# Patient Record
Sex: Male | Born: 1965 | Hispanic: Yes | Marital: Single | Smoking: Never smoker
Health system: Southern US, Community
[De-identification: ages and names within clinical notes are randomized; demographics above are authoritative.]

## PROBLEM LIST (undated history)

## (undated) DIAGNOSIS — F419 Anxiety disorder, unspecified: Secondary | ICD-10-CM

## (undated) HISTORY — PX: HERNIA REPAIR: SHX51

## (undated) HISTORY — PX: OTHER SURGICAL HISTORY: SHX169

## (undated) HISTORY — PX: APPENDECTOMY: SHX54

## (undated) HISTORY — DX: Anxiety disorder, unspecified: F41.9

## (undated) HISTORY — PX: KNEE ARTHROSCOPY W/ MENISCAL REPAIR: SHX1877

## (undated) HISTORY — PX: NERVE SURGERY: SHX1016

---

## 2003-02-15 ENCOUNTER — Ambulatory Visit (HOSPITAL_BASED_OUTPATIENT_CLINIC_OR_DEPARTMENT_OTHER): Admission: RE | Admit: 2003-02-15 | Discharge: 2003-02-15 | Payer: Self-pay | Admitting: Surgery

## 2007-11-11 ENCOUNTER — Ambulatory Visit (HOSPITAL_COMMUNITY): Admission: RE | Admit: 2007-11-11 | Discharge: 2007-11-11 | Payer: Self-pay | Admitting: Family Medicine

## 2007-11-12 ENCOUNTER — Encounter (INDEPENDENT_AMBULATORY_CARE_PROVIDER_SITE_OTHER): Payer: Self-pay | Admitting: General Surgery

## 2007-11-12 ENCOUNTER — Observation Stay (HOSPITAL_COMMUNITY): Admission: EM | Admit: 2007-11-12 | Discharge: 2007-11-12 | Payer: Self-pay | Admitting: Emergency Medicine

## 2009-03-19 ENCOUNTER — Observation Stay (HOSPITAL_COMMUNITY): Admission: EM | Admit: 2009-03-19 | Discharge: 2009-03-20 | Payer: Self-pay | Admitting: Emergency Medicine

## 2009-03-19 ENCOUNTER — Ambulatory Visit: Payer: Self-pay | Admitting: Family Medicine

## 2009-03-21 ENCOUNTER — Encounter (INDEPENDENT_AMBULATORY_CARE_PROVIDER_SITE_OTHER): Payer: Self-pay | Admitting: *Deleted

## 2009-03-30 ENCOUNTER — Ambulatory Visit: Payer: Self-pay | Admitting: Sports Medicine

## 2009-03-30 ENCOUNTER — Ambulatory Visit (HOSPITAL_COMMUNITY): Admission: RE | Admit: 2009-03-30 | Discharge: 2009-03-30 | Payer: Self-pay | Admitting: Sports Medicine

## 2010-11-28 ENCOUNTER — Encounter: Payer: Self-pay | Admitting: Cardiovascular Disease

## 2010-11-28 ENCOUNTER — Encounter (INDEPENDENT_AMBULATORY_CARE_PROVIDER_SITE_OTHER): Payer: Worker's Compensation

## 2010-11-28 DIAGNOSIS — M79609 Pain in unspecified limb: Secondary | ICD-10-CM

## 2010-11-28 DIAGNOSIS — M7989 Other specified soft tissue disorders: Secondary | ICD-10-CM

## 2010-11-28 DIAGNOSIS — R609 Edema, unspecified: Secondary | ICD-10-CM | POA: Insufficient documentation

## 2010-11-29 ENCOUNTER — Encounter: Payer: Self-pay | Admitting: Cardiovascular Disease

## 2010-12-05 NOTE — Miscellaneous (Signed)
Summary: Orders Update  Clinical Lists Changes  Problems: Added new problem of LEG EDEMA, RIGHT (ICD-782.3) Orders: Added new Test order of Venous Duplex Lower Extremity (Venous Duplex Lower) - Signed

## 2011-01-01 LAB — LIPID PANEL
Cholesterol: 159 mg/dL (ref 0–200)
HDL: 37 mg/dL — ABNORMAL LOW (ref >39)
LDL Cholesterol: 107 mg/dL — ABNORMAL HIGH (ref 0–99)
Triglycerides: 73 mg/dL (ref <150)

## 2011-01-01 LAB — POCT CARDIAC MARKERS
Myoglobin, poc: 63.4 ng/mL (ref 12–200)
Myoglobin, poc: 65.8 ng/mL (ref 12–200)
Troponin i, poc: <0.05 ng/mL (ref 0.00–0.09)
Troponin i, poc: <0.05 ng/mL (ref 0.00–0.09)

## 2011-01-01 LAB — DIFFERENTIAL
Eosinophils Absolute: 0.1 10*3/uL (ref 0.0–0.7)
Eosinophils Relative: 3 % (ref 0–5)
Lymphocytes Relative: 34 % (ref 12–46)
Lymphs Abs: 1.9 10*3/uL (ref 0.7–4.0)
Monocytes Relative: 8 % (ref 3–12)

## 2011-01-01 LAB — BASIC METABOLIC PANEL
Chloride: 105 mEq/L (ref 96–112)
Chloride: 108 mEq/L (ref 96–112)
GFR calc Af Amer: >60 mL/min (ref >60)
GFR calc non Af Amer: >60 mL/min (ref >60)
GFR calc non Af Amer: >60 mL/min (ref >60)
Potassium: 4 mEq/L (ref 3.5–5.1)
Potassium: 4.1 mEq/L (ref 3.5–5.1)
Sodium: 137 mEq/L (ref 135–145)
Sodium: 141 mEq/L (ref 135–145)

## 2011-01-01 LAB — CBC
HCT: 44.6 % (ref 39.0–52.0)
HCT: 44.9 % (ref 39.0–52.0)
Hemoglobin: 15.1 g/dL (ref 13.0–17.0)
Hemoglobin: 15.2 g/dL (ref 13.0–17.0)
MCV: 97.1 fL (ref 78.0–100.0)
MCV: 97.1 fL (ref 78.0–100.0)
Platelets: 217 10*3/uL (ref 150–400)
RBC: 4.62 MIL/uL (ref 4.22–5.81)
WBC: 5.6 10*3/uL (ref 4.0–10.5)
WBC: 6.6 10*3/uL (ref 4.0–10.5)

## 2011-01-01 LAB — TROPONIN I: Troponin I: 0.02 ng/mL (ref 0.00–0.06)

## 2011-01-01 LAB — CARDIAC PANEL(CRET KIN+CKTOT+MB+TROPI)
CK, MB: 1.6 ng/mL (ref 0.3–4.0)
Relative Index: 1.1 (ref 0.0–2.5)
Relative Index: 1.2 (ref 0.0–2.5)

## 2011-01-01 LAB — TSH: TSH: 0.966 u[IU]/mL (ref 0.350–4.500)

## 2011-01-01 LAB — CK TOTAL AND CKMB (NOT AT ARMC): Total CK: 190 U/L (ref 7–232)

## 2011-02-06 NOTE — Op Note (Signed)
NAMEMARVELL, [name]             ACCOUNT NO.:  [id number]   MEDICAL RECORD NO.:  [id number]          PATIENT TYPE:  INP   LOCATION:  5128                         FACILITY:  [facility]   PHYSICIAN:  Gabrielle Dare. [name], M.D.DATE OF BIRTH:  1966-01-07   DATE OF PROCEDURE:  11/12/2007  DATE OF DISCHARGE:                               OPERATIVE REPORT   PREOPERATIVE DIAGNOSIS:  Acute appendicitis.   POSTOPERATIVE DIAGNOSIS:  Acute appendicitis.   PROCEDURE:  Laparoscopic appendectomy.   SURGEON:  Gabrielle Dare. [name], M.D.   ANESTHESIA:  General.   HISTORY OF PRESENT ILLNESS:  [name] is a 45 year old Hispanic  male who developed periumbilical pain last night around 6 p.m.  [facility]  [facility] on [address] and sent for CT scan  at [facility].  CT scan of the abdomen and pelvis demonstrated  acute appendicitis.  He was directed to the emergency department.  I  evaluated him there.  History and physical examination are also  consistent with acute appendicitis and he was brought emergently to the  operating room for laparoscopic appendectomy.   PROCEDURE IN DETAIL:  Informed consent was obtained.  The patient was  identified in the preoperative holding area.  He received intravenous  antibiotics.  He was brought through the operating room and general  anesthesia was administered by anesthesia staff.  His abdomen was  prepped and draped in a sterile fashion.  Infraumbilical region was  infiltrated with quarter percent Marcaine with epinephrine.  Infraumbilical incision was made.  Subcutaneous tissues were dissected  down revealing the anterior fascia.  This was divided sharply and the  peritoneal cavity was entered under direct vision without difficulty.  A  zero Vicryl pursestring suture was placed around the fascial opening and  a Hasson trocar was inserted into the abdomen.  The abdomen was  insufflated with carbon dioxide in standard fashion.   Under direct  vision a left lower quadrant 12 mm port and a 5 mm right mid abdomen  port were placed carefully.  Quarter percent Marcaine with epinephrine  was used at both port sites.  Laparoscopic exploration revealed an  inflamed but not perforated appendix.  The appendix was taken down from  lateral peritoneal attachments with the Harmonic scalpel staying right  along the edge of the appendix.  The mesoappendix was then gradually  divided with the Harmonic scalpel achieving excellent hemostasis.  This  was carefully continued down to the base.  The base of the appendix was  intact and viable.  The base of the appendix was then divided with an  endoscopic GIA stapler with vascular load.  Excellent closure was  obtained and the staple line was intact.  The appendix was placed in an  EndoCatch bag and removed from the abdomen via the left lower quadrant  port site.  The abdomen was copiously irrigated.  One portion of the  mesoappendix had a slight ooze.  This was controlled with careful Bovie  cautery.  The abdomen was copiously irrigated with warm saline.  Irrigation fluid returned clear.  Staple line remained  intact with  excellent closure.  Mesoappendix was dry.  The remainder of the  irrigation fluid was evacuated.  The ports were then removed under  direct vision.  The pneumoperitoneum was released.  The infraumbilical  fascia was closed by thing the zero Vicryl pursestring suture with care  not to trap any intra-abdominal contents.  All three wounds were  copiously irrigated.  The skin at the left lower quadrant and  infraumbilical wound  was closed with a running 4-0 Vicryl subcuticular stitch.  All three  wounds were then closed with Dermabond.  Sponge, needle and instrument  counts were all correct.  The patient tolerated the procedure well  without apparent complications and was taken to the recovery room in  stable condition.      Gabrielle Dare [name], M.D.  Electronically  Signed     BET/MEDQ  D:  11/12/2007  T:  11/12/2007  Job:  [id number]   cc:   [facility]

## 2011-02-06 NOTE — Discharge Summary (Signed)
NAMETANVEER, BRAMMER NO.:  [id number]   MEDICAL RECORD NO.:  [id number]          PATIENT TYPE:  OBV   LOCATION:  3714                         FACILITY:  [facility]   PHYSICIAN:  Pearlean Brownie, M.D.DATE OF BIRTH:  1966-08-17   DATE OF ADMISSION:  03/19/2009  DATE OF DISCHARGE:  03/20/2009                               DISCHARGE SUMMARY   CHIEF COMPLAINT:  Chest pain.   CONSULTATIONS:  None.   DISCHARGE MEDICATIONS:  None.   PROCEDURES:  1. A portable chest x-ray which showed no acute finding.  2. An EKG which showed normal sinus rhythm with a rate of 58 beats per      minute and no ST or T changes.   LABORATORY DATA:  Cardiac enzymes are negative x3.  Point-of-care  cardiac enzymes are negative x2.  On day of discharge, the patient's  basic metabolic panel is within normal limits with a creatinine of 0.98.  His CBC is also within normal limits with a white blood count of 6.6,  hemoglobin of 15.2, and a platelet count of 212.  His TSH is 0.966 and a  fasting lipid profile shows a total cholesterol of 159, triglycerides of  73, HDL of 37, LDL 107, VLDL 15, and a total cholesterol to HDL ratio of  4.3.   BRIEF HOSPITAL COURSE:  The patient is a 45 year old male with no  significant past medical history who is admitted with chest pain that he  described as pressure left-sided and without radiation.  The pain had  been occurring off and on for approximately a week.  He presented to his  primary care physician which is [facility], and was transported  to [facility] by EMS.  Negative chest x-ray.  Negative EKG were obtained  as noted in the labs.  Negative cardiac enzymes x3.  The patient was  started on a beta-blocker overnight and was discontinued.  He was also  given aspirin, this was not continued at discharge.  The patient was  discharged to home in stable medical condition and without any further  chest pain.  He is to follow up with his primary  care physician at  [facility] in a week.  He is to follow up with [facility] for an exercise tolerance test.  He will be  called with an appointment for this test.      Romero Belling, MD  Electronically Signed      Pearlean Brownie, M.D.  Electronically Signed    MO/MEDQ  D:  03/20/2009  T:  03/20/2009  Job:  [id number]

## 2011-02-06 NOTE — H&P (Signed)
Anthony, Cook NO.:  [id number]   MEDICAL RECORD NO.:  [id number]          PATIENT TYPE:  OBV   LOCATION:  3714                         FACILITY:  [facility]   PHYSICIAN:  Pearlean Brownie, M.D.DATE OF BIRTH:  01/04/1966   DATE OF ADMISSION:  03/19/2009  DATE OF DISCHARGE:                              HISTORY & PHYSICAL   PRIMARY CARE PHYSICIAN:  [facility].   CHIEF COMPLAINT:  Chest pain.   HISTORY OF PRESENT ILLNESS:  [name] is a pleasant 45 year old  male who presented to the ED by EMS from [facility] with a  complaint of chest pain.  He describes the chest pain as a pressure, it  is left sided without radiation, it has been occurring off and on for 1  week.  On Wednesday and Thursday, he also felt very shaky with it  like a panic attacking.  The pain is not associated with nausea or  shortness of breath.  He states he has been sweaty with the pain, but  states he has been sweaty most of the week from the hot weather.  He had  similar pain back in May.  He saw his primary care physician who told  him it was likely muscular.  He was given muscle relaxers and the pain  went away.  He decided to go back to the doctor today.  They gave him  324 mg of aspirin there and called EMS.  He denies any aggravating or  relieving factors and states when the pain occurs, it only last a few  minutes.   REVIEW OF SYSTEMS:  He denies any fevers, chills, cough, runny nose,  headache, visual changes, abdominal pain, dysuria, nausea, vomiting,  diarrhea, or change in bowel or bladder.   PAST MEDICAL HISTORY:  Significant only for an appendectomy in February  2009.   MEDICATIONS:  None.   ALLERGIES:  None.   FAMILY HISTORY:  He has 1 sister and 1 brother who had some heart  problem in their 92s that sound like a cath, but no history of MI.  His  parents were healthy except for cancer.   SOCIAL HISTORY:  He is divorced.  He has no children.  He  works for [employer].  He lives alone.  He denies any tobacco or drugs.  He does endorse 2-3  beers in the weekend.   PHYSICAL EXAMINATION:  VITAL SIGNS:  His temperature is 98.1, pulse 62,  respirations 16, blood pressure 128/81, and saturating 100% on 2 L.  GENERAL:  He is alert and oriented x3, comfortable in no acute distress  and pleasant.  HEENT:  His extraocular movements intact.  Pupils equal, round, reactive  to light.  Mucous membranes are moist, and his oropharynx is clear.  NECK:  Full range of motion without lymphadenopathy.  CV:  Regular rate and rhythm without murmur, rub, or gallop.  LUNGS:  Clear to auscultation bilaterally with a normal work of  breathing.  ABDOMEN:  Soft, nontender, nondistended with positive bowel sounds.  EXTREMITIES:  No clubbing, cyanosis, or edema.  SKIN:  Without rash  or lesion.   Chest x-ray shows no acute process.  He had a CBC that was completely  normal with a white count of 5.6 and hemoglobin of 15.1.  He had a BMET  that was also completely normal with a BUN of 19 and creatinine of 0.94.  He has had point-of-care enzymes that were negative x2.  His EKG was  sinus with normal intervals with less than 1 mm ST elevation II, V3, and  V4, but it is relatively unchanged from an EKG on September 30, 2007.   ASSESSMENT AND PLAN:  This is a 45 year old male with chest pain.  1. Chest pain.  We will admit him to a tele floor for chest pain rule      out.  We will check cardiac enzymes q.8 h. x3 and repeat an EKG in      the morning.  We will also check a fasting lipid panel and a TSH.      We will give aspirin and start a low-dose beta-blocker.  Does sound      like he has a family history of coronary artery disease.  His chest      pain does sound mostly atypical.  Give him nitro if needed for      further chest pain.  We will also give Protonix in case there is a      GI component.  2. Prophylaxis.  We will give PPI for possible GI component of chest       pain and Lovenox for DVT prophylaxis.  3. Disposition is pending the results of the cardiac workup.      Ardeen Garland, MD  Electronically Signed      Pearlean Brownie, M.D.  Electronically Signed    LM/MEDQ  D:  03/19/2009  T:  03/19/2009  Job:  [id number]

## 2011-02-06 NOTE — H&P (Signed)
[name], [name]             ACCOUNT NO.:  [id number]   MEDICAL RECORD NO.:  [id number]          PATIENT TYPE:  INP   LOCATION:  5128                         FACILITY:  [facility]   PHYSICIAN:  Gabrielle Dare. [name], M.D.DATE OF BIRTH:  [birth year]   DATE OF ADMISSION:  11/12/2007  DATE OF DISCHARGE:                              HISTORY & PHYSICAL   CHIEF COMPLAINT:  Periumbilical and right lower quadrant abdominal pain.   HISTORY OF PRESENT ILLNESS:  The patient is a 45 year old Hispanic male  who developed periumbilical pain around 6:00 p.m. on November 11, 2007.  [facility] on [address] and was sent for an  outpatient CT scan of the abdomen and pelvis at [facility].  CT  demonstrated acute appendicitis.  He was directed to the emergency  department.  He continues to have pain in the periumbilical region and  right lower quadrant.  He denies nausea and vomiting.   PAST MEDICAL HISTORY:  Negative.   SURGICAL HISTORY:  Right inguinal hernia repair and knee surgery.   SOCIAL HISTORY:  Does not smoke.  He works for [employer].  He does not drink  excessively.   ALLERGIES:  NO KNOWN DRUG ALLERGIES.   MEDICATIONS:  None.   REVIEW OF SYSTEMS:  GI: Localized pain as described above with no  significant nausea and vomiting.  PULMONARY:  Negative.  CARDIOVASCULAR:  Negative.  GU: Negative.  MUSCULOSKELETAL:  Negative.  Remainder of  review of systems was unremarkable.   PHYSICAL EXAMINATION:  VITAL SIGNS:  Temperature 97.3, pulse 71,  respirations 20, blood pressure 111/67.  GENERAL:  He is awake and alert.  He is in no distress.  HEENT:  Pupils are equal and reactive.  Sclerae is clear.  Oral mucosa  is moist.  NECK:  Supple with no masses or tenderness.  LUNGS:  Clear to auscultation with good respiratory effort.  No wheezing  is heard.  HEART:  Regular.  No murmurs are heard.  Impulses palpable in the left  chest.  ABDOMEN:  Tender in the right lower quadrant with  no guarding.  No  masses are felt.  He has hypoactive bowel sounds.  No organomegaly is  noted.  SKIN:  Warm and dry.  No rashes are present.  NEUROLOGIC: He is alert and oriented.  Speech is fluent.  No focal  deficits are noted.   LABORATORY STUDIES:  Sodium 135, potassium 4.4, chloride 102, CO2 25,  BUN 17, creatinine 1.1, glucose 107.  White blood cell count 14,  hemoglobin 16.8, platelets 258.  CT scan of the abdomen and pelvis as  described above.   IMPRESSION:  Acute appendicitis.   PLAN:  We will take him to the operating room for laparoscopic  appendectomy.  We will give him IV antibiotics.  Procedure, risks and  benefits were discussed in detail with the patient.  Questions were  answered and he is agreeable.      Gabrielle Dare [name], M.D.  Electronically Signed     BET/MEDQ  D:  11/12/2007  T:  11/12/2007  Job:  [id number]

## 2011-02-09 NOTE — Op Note (Signed)
[name], [name]                         ACCOUNT NO.:  [id number]   MEDICAL RECORD NO.:  [id number]                   PATIENT TYPE:  AMB   LOCATION:  DSC                                  FACILITY:  [facility]   PHYSICIAN:  Currie Paris, M.D.           DATE OF BIRTH:  August 31, 1966   DATE OF PROCEDURE:  02/15/2003  DATE OF DISCHARGE:                                 OPERATIVE REPORT   [facility] [phone].   PREOPERATIVE DIAGNOSIS:  Right inguinal hernia.   POSTOPERATIVE DIAGNOSIS:  Right inguinal hernia, direct.   PROCEDURE:  Repair, direct right inguinal hernia, with mesh.   SURGEON:  Currie Paris, M.D.   ANESTHESIA:  MAC.   CLINICAL HISTORY:  This patient has recently presented with a right inguinal  hernia that he needed to have repaired.   DESCRIPTION OF PROCEDURE:  The patient was seen in the holding area and had  no further questions.  The right side was identified as the operative side  and the right inguinal are marked.  The patient was then taken to the  operating room and given IV sedation.  The groin area was clipped, prepped,  and draped.  Local was a combination of 1% Xylocaine with epinephrine plus  0.5% plain Marcaine mixed equally.  I infiltrated along the skin line and  then subfascially above the anterior superior iliac spine.  Incision was  made and deepened to the external oblique aponeurosis with bleeders either  electrocoagulated or tied with 4-0 Vicryl.  Additional local was infiltrated  as we got to deeper layers.   The external oblique was exposed and then opened in the line of its fibers  into the superficial ring.  It was lifted up off the underlying muscle and  the cord dissected off and surrounded with a Penrose drain.  There was a  direct hernia present stuck to the cord and really intertwined into the  cremaster muscle, so I had to divide a little bit of that.  I opened the  direct sac a little bit to get better reduction of it and to get it  cleaned  off so that it reduced nicely.  There was no indirect component noted.   Once everything was reduced and we had the cord cleaned off nicely and it  appeared intact, I went ahead and put a large mesh plug into the defect and  held it with several sutures of 2-0 Prolene sutured circumferentially.  I  then put the patch overlay and sutured it with a running suture inferiorly  and then starting at the pubic tubercle and running to the deep ring.  It  was then laid well over the internal oblique.  The ilioinguinal nerve was  noted and brought out through the slit in the lateral portion of the mesh.  The mesh was then tacked down laterally and lay nicely to reconstitute  widely the deep ring.  There  was no tension, everything appeared to close  nicely, and it appeared all dry.  The cord had plenty of room to come  through the mesh.   The external oblique was then closed with some 3-0 Vicryl, Scarpa's, with 3-  0 Vicryl, and skin with 4-0 Monocryl subcuticular plus Dermabond.   The patient tolerated the procedure well.  There were no operative  complications, and all counts were correct.                                               Currie Paris, M.D.    CJS/MEDQ  D:  02/15/2003  T:  02/15/2003  Job:  [id number]   cc:   Tracey Harries, M.D.  [address]  [place]  [place] [address]  Fax: [phone]

## 2011-06-15 LAB — CBC
HCT: 48.9
Hemoglobin: 16.8
Platelets: 258
RBC: 5.19
WBC: 14 — ABNORMAL HIGH

## 2011-06-15 LAB — POCT I-STAT CREATININE: Creatinine, Ser: 1.1

## 2011-06-15 LAB — I-STAT 8, (EC8 V) (CONVERTED LAB)
BUN: 17
Chloride: 102
Glucose, Bld: 107 — ABNORMAL HIGH
Hemoglobin: 17.7 — ABNORMAL HIGH
Operator id: 257131
pCO2, Ven: 42.4 — ABNORMAL LOW

## 2011-06-15 LAB — DIFFERENTIAL
Eosinophils Relative: 0
Lymphocytes Relative: 11 — ABNORMAL LOW
Lymphs Abs: 1.5
Monocytes Absolute: 0.6
Monocytes Relative: 4

## 2011-09-02 ENCOUNTER — Ambulatory Visit (INDEPENDENT_AMBULATORY_CARE_PROVIDER_SITE_OTHER): Payer: BC Managed Care – PPO

## 2011-09-02 DIAGNOSIS — H60339 Swimmer's ear, unspecified ear: Secondary | ICD-10-CM

## 2011-09-02 DIAGNOSIS — R609 Edema, unspecified: Secondary | ICD-10-CM

## 2011-09-02 DIAGNOSIS — R21 Rash and other nonspecific skin eruption: Secondary | ICD-10-CM

## 2011-09-02 DIAGNOSIS — M25519 Pain in unspecified shoulder: Secondary | ICD-10-CM

## 2011-10-24 ENCOUNTER — Other Ambulatory Visit: Payer: Self-pay | Admitting: Family Medicine

## 2011-10-26 ENCOUNTER — Ambulatory Visit (INDEPENDENT_AMBULATORY_CARE_PROVIDER_SITE_OTHER): Payer: BC Managed Care – PPO | Admitting: Family Medicine

## 2011-10-26 VITALS — BP 117/76 | HR 55 | Temp 98.5°F | Resp 16 | Ht 69.0 in | Wt 176.4 lb

## 2011-10-26 DIAGNOSIS — M542 Cervicalgia: Secondary | ICD-10-CM

## 2011-10-26 DIAGNOSIS — M79601 Pain in right arm: Secondary | ICD-10-CM

## 2011-10-26 DIAGNOSIS — M79609 Pain in unspecified limb: Secondary | ICD-10-CM

## 2011-10-26 MED ORDER — METHYLPREDNISOLONE 4 MG PO KIT: PACK | ORAL | Status: AC

## 2011-10-26 NOTE — Progress Notes (Signed)
This is a 46 year old man from [place] with a two-month history of right arm pain and paresthesias. He had originally come in and gotten this evaluated in December and was given several days of Medrol which relieved his symptoms somewhat. Since that time he's been going to chiropractor who has been providing electric stimulation and neck adjustments. Over the past several weeks pain has gotten more intense on the right side including the right arm and right leg and the patient was unable to work a couple days this week. He does not have any loss of motor function he has had no fever he has had no trauma to his neck or arm.  Objective: Patient is in no acute distress and shows no bony muscular or skin changes strength is normal reflexes are normal he has decreased range of motion of his neck with inability to look greater than 45 to the right he has no neck tenderness or adenopathy or bony changes. 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 patient call me back in 48 hours to give me a progress report in 48 hours.   Medrol Dosepak with one refill

## 2012-04-19 ENCOUNTER — Ambulatory Visit (INDEPENDENT_AMBULATORY_CARE_PROVIDER_SITE_OTHER): Payer: BC Managed Care – PPO | Admitting: Family Medicine

## 2012-04-19 VITALS — BP 114/70 | HR 61 | Temp 98.6°F | Resp 20 | Ht 69.0 in | Wt 177.0 lb

## 2012-04-19 DIAGNOSIS — H6990 Unspecified Eustachian tube disorder, unspecified ear: Secondary | ICD-10-CM

## 2012-04-19 DIAGNOSIS — R42 Dizziness and giddiness: Secondary | ICD-10-CM

## 2012-04-19 DIAGNOSIS — H9209 Otalgia, unspecified ear: Secondary | ICD-10-CM

## 2012-04-19 DIAGNOSIS — R079 Chest pain, unspecified: Secondary | ICD-10-CM

## 2012-04-19 DIAGNOSIS — H6980 Other specified disorders of Eustachian tube, unspecified ear: Secondary | ICD-10-CM

## 2012-04-19 LAB — POCT CBC
HCT, POC: 52.6 % (ref 43.5–53.7)
Hemoglobin: 16.9 g/dL (ref 14.1–18.1)
Lymph, poc: 2.3 (ref 0.6–3.4)
MCHC: 32.1 g/dL (ref 31.8–35.4)
POC Granulocyte: 5 (ref 2–6.9)
WBC: 7.8 10*3/uL (ref 4.6–10.2)

## 2012-04-19 LAB — COMPREHENSIVE METABOLIC PANEL
Albumin: 4.6 g/dL (ref 3.5–5.2)
CO2: 28 mEq/L (ref 19–32)
Calcium: 9.3 mg/dL (ref 8.4–10.5)
Glucose, Bld: 93 mg/dL (ref 70–99)
Sodium: 137 mEq/L (ref 135–145)
Total Bilirubin: 0.6 mg/dL (ref 0.3–1.2)
Total Protein: 6.7 g/dL (ref 6.0–8.3)

## 2012-04-19 MED ORDER — OXAPROZIN 600 MG PO TABS: ORAL_TABLET | ORAL | Status: AC

## 2012-04-19 MED ORDER — MECLIZINE HCL 25 MG PO TABS: 25.0000 mg | ORAL_TABLET | Freq: Three times a day (TID) | ORAL | Status: AC | PRN

## 2012-04-19 MED ORDER — FLUTICASONE PROPIONATE 50 MCG/ACT NA SUSP: NASAL | Status: DC

## 2012-04-19 NOTE — Patient Instructions (Signed)
Take the medications as noted. Return if problems arise.

## 2012-04-19 NOTE — Progress Notes (Signed)
Subjective: Patient has been having pain in his ears. It has gone down in his chest and his left arm and having dizziness. He has had some similar symptoms in the past, and reviewing his old records does not reveal any major findings. He works at [employer]. Does not smoke. Does not drink much. He does a lot of physical labor though he does not do regular exercise.  Objective: Smartt red-faced white male no acute distress. He is strong Audiological scientist. His TMs are normal. Throat clear. Neck supple without nodes or thyromegaly. Chest clear. Heart regular without murmurs. Abdomen soft without masses tenderness. Chest wall is a little tender on the left.       Results for orders placed in visit on 04/19/12  POCT CBC      Component Value Range   WBC 7.8  4.6 - 10.2 K/uL   Lymph, poc 2.3  0.6 - 3.4   POC LYMPH PERCENT 30.0  10 - 50 %L   MID (cbc) 0.5  0 - 0.9   POC MID % 5.8  0 - 12 %M   POC Granulocyte 5.0  2 - 6.9   Granulocyte percent 64.2  37 - 80 %G   RBC 5.28  4.69 - 6.13 M/uL   Hemoglobin 16.9  14.1 - 18.1 g/dL   HCT, POC 47.8  29.5 - 53.7 %   MCV 99.7 (*) 80 - 97 fL   MCH, POC 32.0 (*) 27 - 31.2 pg   MCHC 32.1  31.8 - 35.4 g/dL   RDW, POC 62.1     Platelet Count, POC 287  142 - 424 K/uL   MPV 8.7  0 - 99.8 fL   Ekg:  Normal  Assessment: Dizziness Otalgia Chest pains, musculoskeletal Eustachian tube dysfunction  Plan: Treated for eustachian tube dysfunction and chest wall pain. Dizziness.

## 2012-04-21 ENCOUNTER — Encounter: Payer: Self-pay | Admitting: Family Medicine

## 2013-06-01 ENCOUNTER — Ambulatory Visit (INDEPENDENT_AMBULATORY_CARE_PROVIDER_SITE_OTHER): Payer: BC Managed Care – PPO | Admitting: Family Medicine

## 2013-06-01 VITALS — BP 122/72 | HR 61 | Temp 98.0°F | Resp 17 | Ht 69.0 in | Wt 182.0 lb

## 2013-06-01 DIAGNOSIS — R51 Headache: Secondary | ICD-10-CM

## 2013-06-01 DIAGNOSIS — H5711 Ocular pain, right eye: Secondary | ICD-10-CM

## 2013-06-01 DIAGNOSIS — H5710 Ocular pain, unspecified eye: Secondary | ICD-10-CM

## 2013-06-01 LAB — POCT SEDIMENTATION RATE: POCT SED RATE: 7 mm/hr (ref 0–22)

## 2013-06-01 NOTE — Progress Notes (Signed)
[facility]:  Office Visit  Chief Complaint:  Chief Complaint  Patient presents with  . Foreign Body in Eye    HPI: [name] is a 47 y.o. male who complains of having something in his eye x 1 week, noticed it after wind may have gotten something in eye. He extracted a piece of clear object at the corner of his eye after the incident, maybe sand he states. 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  He now has right sided temporal HA, he states it is uncomfortable. He denies photophobia, swelling redness or drainage.He has had no prior eye issues with this eye.  He feels like "it"  moves around his eye. He denies burning, numbness, tingling. Denies rash. Deneis itching.   History reviewed. No pertinent past medical history. History reviewed. No pertinent past surgical history. History   Social History  . Marital Status: Single    Spouse Name: N/A    Number of Children: N/A  . Years of Education: N/A   Social History Main Topics  . Smoking status: Never Smoker   . Smokeless tobacco: None  . Alcohol Use: No  . Drug Use: No  . Sexual Activity: No   Other Topics Concern  . None   Social History Narrative  . None   Family History  Problem Relation Age of Onset  . Cancer Mother    No Known Allergies Prior to Admission medications   Not on File     ROS: The patient denies fevers, chills, night sweats, unintentional weight loss, chest pain, palpitations, wheezing, dyspnea on exertion, nausea, vomiting, abdominal pain, dysuria, hematuria, melena, numbness, weakness, or tingling.   All other systems have been reviewed and were otherwise negative with the exception of those mentioned in the HPI and as above.    PHYSICAL EXAM: Filed Vitals:   06/01/13 1129  BP: 122/72  Pulse: 61  Temp: 98 F (36.7 C)  Resp: 17   Filed Vitals:   06/01/13 1129  Height: 5\' 9"  (1.753 m)   Weight: 182 lb (82.555 kg)   Body mass index is 26.86 kg/(m^2).  General: Alert, no acute distress HEENT:  Normocephalic, atraumatic, oropharynx patent. EOMI, PERRLA, fundoscopic exam nl, 2 small pinpoint pimple like papules does not appear to be infected or acute onupper eyelid when flipped over Cardiovascular:  Regular rate and rhythm, no rubs murmurs or gallops.  No Carotid bruits, radial pulse intact. No pedal edema.  Respiratory: Clear to auscultation bilaterally.  No wheezes, rales, or rhonchi.  No cyanosis, no use of accessory musculature GI: No organomegaly, abdomen is soft and non-tender, positive bowel sounds.  No masses. Skin: No rashes. Neurologic: Facial musculature symmetric. Psychiatric: Patient is appropriate throughout our interaction. Lymphatic: No cervical lymphadenopathy Musculoskeletal: Gait intact.   LABS: Results for orders placed in visit on 06/01/13  POCT SEDIMENTATION RATE      Result Value Range   POCT SED RATE 7  0 - 22 mm/hr     EKG/XRAY:   Primary read interpreted by Dr. Conley Rolls at [facility].   ASSESSMENT/PLAN: Encounter Diagnoses  Name Primary?  . Headache(784.0) Yes  . Eye pain, right     Fluoroscein exam was normal, no uptake Did not see anything on eye exam, we were able to flush eye out with saline water and he felt slightly better Vision test is normal Will check ESR for  temporal HA Keep eyes moist with natural tears QID, f/u prn No need for abx since does not appear to be infected Gross sideeffects, risk and benefits, and alternatives of medications d/w patient. Patient is aware that all medications have potential sideeffects and we are unable to predict every sideeffect or drug-drug interaction that may occur.  Maresa Morash PHUONG, DO 06/03/2013 5:00 PM    LM on 06/03/13 @ 5:11 pm to see how he is doing. Lm regarding also normal ESR.

## 2013-06-03 ENCOUNTER — Encounter: Payer: Self-pay | Admitting: *Deleted

## 2013-06-03 ENCOUNTER — Telehealth: Payer: Self-pay | Admitting: *Deleted

## 2013-06-03 NOTE — Telephone Encounter (Signed)
Pt states that his eye is no better and would like to be referred to Dr. Dione Booze.

## 2013-06-04 ENCOUNTER — Other Ambulatory Visit: Payer: Self-pay | Admitting: Family Medicine

## 2013-06-04 DIAGNOSIS — H5789 Other specified disorders of eye and adnexa: Secondary | ICD-10-CM

## 2013-11-01 ENCOUNTER — Ambulatory Visit (INDEPENDENT_AMBULATORY_CARE_PROVIDER_SITE_OTHER): Payer: BC Managed Care – PPO | Admitting: Family Medicine

## 2013-11-01 VITALS — BP 120/81 | HR 72 | Temp 98.6°F | Resp 18 | Wt 185.0 lb

## 2013-11-01 DIAGNOSIS — R21 Rash and other nonspecific skin eruption: Secondary | ICD-10-CM

## 2013-11-01 DIAGNOSIS — K4090 Unilateral inguinal hernia, without obstruction or gangrene, not specified as recurrent: Secondary | ICD-10-CM

## 2013-11-01 MED ORDER — CLOTRIMAZOLE 1 % EX CREA: 1.0000 "application " | TOPICAL_CREAM | Freq: Two times a day (BID) | CUTANEOUS | Status: DC

## 2013-11-01 NOTE — Patient Instructions (Signed)
We will refer you to surgeon for the hernia on the left side. Avoid heavy lifting or straining until that evaluation. Return to the clinic or go to the nearest emergency room if any of your symptoms worsen or new symptoms occur.  Try the cream to the arm rash for next 2 weeks. If other rash returns - return for recheck.   Inguinal Hernia, Adult Muscles help keep everything in the body in its proper place. But if a weak spot in the muscles develops, something can poke through. That is called a hernia. When this happens in the lower part of the belly (abdomen), it is called an inguinal hernia. (It takes its name from a part of the body in this region called the inguinal canal.) A weak spot in the wall of muscles lets some fat or part of the small intestine bulge through. An inguinal hernia can develop at any age. Men get them more often than women. CAUSES  In adults, an inguinal hernia develops over time.  It can be triggered by:  Suddenly straining the muscles of the lower abdomen.  Lifting heavy objects.  Straining to have a bowel movement. Difficult bowel movements (constipation) can lead to this.  Constant coughing. This may be caused by smoking or lung disease.  Being overweight.  Being pregnant.  Working at a job that requires long periods of standing or heavy lifting.  Having had an inguinal hernia before. One type can be an emergency situation. It is called a strangulated inguinal hernia. It develops if part of the small intestine slips through the weak spot and cannot get back into the abdomen. The blood supply can be cut off. If that happens, part of the intestine may die. This situation requires emergency surgery. SYMPTOMS  Often, a small inguinal hernia has no symptoms. It is found when a healthcare provider does a physical exam. Larger hernias usually have symptoms.   In adults, symptoms may include:  A lump in the groin. This is easier to see when the person is standing.  It might disappear when lying down.  In men, a lump in the scrotum.  Pain or burning in the groin. This occurs especially when lifting, straining or coughing.  A dull ache or feeling of pressure in the groin.  Signs of a strangulated hernia can include:  A bulge in the groin that becomes very painful and tender to the touch.  A bulge that turns red or purple.  Fever, nausea and vomiting.  Inability to have a bowel movement or to pass gas. DIAGNOSIS  To decide if you have an inguinal hernia, a healthcare provider will probably do a physical examination.  This will include asking questions about any symptoms you have noticed.  The healthcare provider might feel the groin area and ask you to cough. If an inguinal hernia is felt, the healthcare provider may try to slide it back into the abdomen.  Usually no other tests are needed. TREATMENT  Treatments can vary. The size of the hernia makes a difference. Options include:  Watchful waiting. This is often suggested if the hernia is small and you have had no symptoms.  No medical procedure will be done unless symptoms develop.  You will need to watch closely for symptoms. If any occur, contact your healthcare provider right away.  Surgery. This is used if the hernia is larger or you have symptoms.  Open surgery. This is usually an outpatient procedure (you will not stay overnight in a  hospital). An cut (incision) is made through the skin in the groin. The hernia is put back inside the abdomen. The weak area in the muscles is then repaired by herniorrhaphy or hernioplasty. Herniorrhaphy: in this type of surgery, the weak muscles are sewn back together. Hernioplasty: a patch or mesh is used to close the weak area in the abdominal wall.  Laparoscopy. In this procedure, a surgeon makes small incisions. A thin tube with a tiny video camera (called a laparoscope) is put into the abdomen. The surgeon repairs the hernia with mesh by looking  with the video camera and using two long instruments. HOME CARE INSTRUCTIONS   After surgery to repair an inguinal hernia:  You will need to take pain medicine prescribed by your healthcare provider. Follow all directions carefully.  You will need to take care of the wound from the incision.  Your activity will be restricted for awhile. This will probably include no heavy lifting for several weeks. You also should not do anything too active for a few weeks. When you can return to work will depend on the type of job that you have.  During "watchful waiting" periods, you should:  Maintain a healthy weight.  Eat a diet high in fiber (fruits, vegetables and whole grains).  Drink plenty of fluids to avoid constipation. This means drinking enough water and other liquids to keep your urine clear or pale yellow.  Do not lift heavy objects.  Do not stand for long periods of time.  Quit smoking. This should keep you from developing a frequent cough. SEEK MEDICAL CARE IF:   A bulge develops in your groin area.  You feel pain, a burning sensation or pressure in the groin. This might be worse if you are lifting or straining.  You develop a fever of more than 100.5 F (38.1 C). SEEK IMMEDIATE MEDICAL CARE IF:   Pain in the groin increases suddenly.  A bulge in the groin gets bigger suddenly and does not go down.  For men, there is sudden pain in the scrotum. Or, the size of the scrotum increases.  A bulge in the groin area becomes red or purple and is painful to touch.  You have nausea or vomiting that does not go away.  You feel your heart beating much faster than normal.  You cannot have a bowel movement or pass gas.  You develop a fever of more than 102.0 F (38.9 C). Document Released: 01/27/2009 Document Revised: 12/03/2011 Document Reviewed: 01/27/2009 [facility] Patient Information 2014 [place], [place].

## 2013-11-01 NOTE — Progress Notes (Signed)
Subjective:  This chart was scribed for Meredith Staggers, MD by Carl Best, Medical Scribe. This patient was seen in Room 1 and the patient's care was started at 10:29 AM.   Patient ID: [name], male    DOB: 03/09/1966, 48 y.o.   MRN: [id number]  HPI HPI Comments: [name] is a 48 y.o. male who presents to the [facility] complaining of constant left sided groin pain that started a month ago.  He denies falling, twisting, or injuring the groin recently.  He states that there is mild swelling to the area.  He states that lifting things aggravates the pain.  He denies experiencing pain when having a bowel movement.  He denies testicular pain, penile pain, or penile discharge as associated symptoms.  He states that he was diagnosed with genital herpes 2 years ago with symptoms that included bumps on his penis.  He denies having a history of gonorrhea or chlamydia.    The patient is also complaining of an intermittent, itchy rash located on his right arm that started less than a month ago.  He states that the rash first started on the front of his chest and later disappeared. He states that he has applied OTC cortisone cream with temporary relief to his symptoms.    The patient states that he works at [employer] [place]      Patient Active Problem List   Diagnosis Date Noted  . LEG EDEMA, RIGHT 11/28/2010   No past medical history on file. No past surgical history on file. No Known Allergies Prior to Admission medications   Not on File   History   Social History  . Marital Status: Single    Spouse Name: N/A    Number of Children: N/A  . Years of Education: N/A   Occupational History  . Not on file.   Social History Main Topics  . Smoking status: Never Smoker   . Smokeless tobacco: Not on file  . Alcohol Use: No  . Drug Use: No  . Sexual Activity: No   Other Topics Concern  . Not on file   Social History Narrative  . No narrative on  file     Review of Systems  Genitourinary: Positive for penile swelling. Negative for discharge, penile pain and testicular pain.  Musculoskeletal: Positive for myalgias (groin, left side).  Skin: Positive for rash (right arm).  All other systems reviewed and are negative.     Objective:  Physical Exam  Vitals reviewed. Constitutional: He is oriented to person, place, and time. He appears well-developed and well-nourished.  HENT:  Head: Normocephalic and atraumatic.  Right Ear: Tympanic membrane, external ear and ear canal normal.  Left Ear: Tympanic membrane, external ear and ear canal normal.  Nose: No rhinorrhea.  Mouth/Throat: Oropharynx is clear and moist and mucous membranes are normal. No oropharyngeal exudate or posterior oropharyngeal erythema.  Eyes: Conjunctivae are normal. Pupils are equal, round, and reactive to light.  Neck: Neck supple.  Cardiovascular: Normal rate, regular rhythm, normal heart sounds and intact distal pulses.   No murmur heard. Pulmonary/Chest: Effort normal and breath sounds normal. He has no wheezes. He has no rhonchi. He has no rales.  Abdominal: Soft. There is no tenderness. A hernia is present. Hernia confirmed positive in the left inguinal area. Hernia confirmed negative in the right inguinal area.  Genitourinary: Right testis shows no tenderness. Left testis shows no tenderness. No discharge found.  Testicles  are non-tender.  No penile discharge.  No external rash.  Palpable inguinal hernia with cough but no external swelling noted on the left side.  Right side without palpable hernia and scar well healed.    Lymphadenopathy:    He has no cervical adenopathy.  Neurological: He is alert and oriented to person, place, and time.  Skin: Skin is warm and dry. Rash noted.  5 mm circular lesion on the right upper arm.  Slight elevated boarders.  Flat scaly central appearance.  No other rash visualized.   Psychiatric: He has a normal mood and  affect. His behavior is normal.     BP 120/81  Pulse 72  Temp(Src) 98.6 F (37 C) (Oral)  Resp 18  Wt 185 lb (83.915 kg)  SpO2 97% Assessment & Plan:   [name] is a 48 y.o. male Left inguinal hernia - Plan: Ambulatory referral to General Surgery - advised to avoid heavy lifting and note given for work for this.  Return to clinic and ER precautions discussed and handout given as below.    Rash and nonspecific skin eruption - Plan: clotrimazole (LOTRIMIN) 1 % cream - area on that arm suspicious for tinea corporis.  Trial of lotrimin for two weeks but if not improving or return of other rash, can return for recheck.     Meds ordered this encounter  Medications  . clotrimazole (LOTRIMIN) 1 % cream    Sig: Apply 1 application topically 2 (two) times daily. To arm rash for 2 weeks.    Dispense:  30 g    Refill:  0   Patient Instructions  We will refer you to surgeon for the hernia on the left side. Avoid heavy lifting or straining until that evaluation. Return to the clinic or go to the nearest emergency room if any of your symptoms worsen or new symptoms occur.  Try the cream to the arm rash for next 2 weeks. If other rash returns - return for recheck.   Inguinal Hernia, Adult Muscles help keep everything in the body in its proper place. But if a weak spot in the muscles develops, something can poke through. That is called a hernia. When this happens in the lower part of the belly (abdomen), it is called an inguinal hernia. (It takes its name from a part of the body in this region called the inguinal canal.) A weak spot in the wall of muscles lets some fat or part of the small intestine bulge through. An inguinal hernia can develop at any age. Men get them more often than women. CAUSES  In adults, an inguinal hernia develops over time.  It can be triggered by:  Suddenly straining the muscles of the lower abdomen.  Lifting heavy objects.  Straining to have a bowel  movement. Difficult bowel movements (constipation) can lead to this.  Constant coughing. This may be caused by smoking or lung disease.  Being overweight.  Being pregnant.  Working at a job that requires long periods of standing or heavy lifting.  Having had an inguinal hernia before. One type can be an emergency situation. It is called a strangulated inguinal hernia. It develops if part of the small intestine slips through the weak spot and cannot get back into the abdomen. The blood supply can be cut off. If that happens, part of the intestine may die. This situation requires emergency surgery. SYMPTOMS  Often, a small inguinal hernia has no symptoms. It is found when a healthcare provider  does a physical exam. Larger hernias usually have symptoms.   In adults, symptoms may include:  A lump in the groin. This is easier to see when the person is standing. It might disappear when lying down.  In men, a lump in the scrotum.  Pain or burning in the groin. This occurs especially when lifting, straining or coughing.  A dull ache or feeling of pressure in the groin.  Signs of a strangulated hernia can include:  A bulge in the groin that becomes very painful and tender to the touch.  A bulge that turns red or purple.  Fever, nausea and vomiting.  Inability to have a bowel movement or to pass gas. DIAGNOSIS  To decide if you have an inguinal hernia, a healthcare provider will probably do a physical examination.  This will include asking questions about any symptoms you have noticed.  The healthcare provider might feel the groin area and ask you to cough. If an inguinal hernia is felt, the healthcare provider may try to slide it back into the abdomen.  Usually no other tests are needed. TREATMENT  Treatments can vary. The size of the hernia makes a difference. Options include:  Watchful waiting. This is often suggested if the hernia is small and you have had no symptoms.  No  medical procedure will be done unless symptoms develop.  You will need to watch closely for symptoms. If any occur, contact your healthcare provider right away.  Surgery. This is used if the hernia is larger or you have symptoms.  Open surgery. This is usually an outpatient procedure (you will not stay overnight in a hospital). An cut (incision) is made through the skin in the groin. The hernia is put back inside the abdomen. The weak area in the muscles is then repaired by herniorrhaphy or hernioplasty. Herniorrhaphy: in this type of surgery, the weak muscles are sewn back together. Hernioplasty: a patch or mesh is used to close the weak area in the abdominal wall.  Laparoscopy. In this procedure, a surgeon makes small incisions. A thin tube with a tiny video camera (called a laparoscope) is put into the abdomen. The surgeon repairs the hernia with mesh by looking with the video camera and using two long instruments. HOME CARE INSTRUCTIONS   After surgery to repair an inguinal hernia:  You will need to take pain medicine prescribed by your healthcare provider. Follow all directions carefully.  You will need to take care of the wound from the incision.  Your activity will be restricted for awhile. This will probably include no heavy lifting for several weeks. You also should not do anything too active for a few weeks. When you can return to work will depend on the type of job that you have.  During "watchful waiting" periods, you should:  Maintain a healthy weight.  Eat a diet high in fiber (fruits, vegetables and whole grains).  Drink plenty of fluids to avoid constipation. This means drinking enough water and other liquids to keep your urine clear or pale yellow.  Do not lift heavy objects.  Do not stand for long periods of time.  Quit smoking. This should keep you from developing a frequent cough. SEEK MEDICAL CARE IF:   A bulge develops in your groin area.  You feel pain, a  burning sensation or pressure in the groin. This might be worse if you are lifting or straining.  You develop a fever of more than 100.5 F (38.1 C). SEEK IMMEDIATE  MEDICAL CARE IF:   Pain in the groin increases suddenly.  A bulge in the groin gets bigger suddenly and does not go down.  For men, there is sudden pain in the scrotum. Or, the size of the scrotum increases.  A bulge in the groin area becomes red or purple and is painful to touch.  You have nausea or vomiting that does not go away.  You feel your heart beating much faster than normal.  You cannot have a bowel movement or pass gas.  You develop a fever of more than 102.0 F (38.9 C). Document Released: 01/27/2009 Document Revised: 12/03/2011 Document Reviewed: 01/27/2009 [facility] Patient Information 2014 [place], [place].       I personally performed the services described in this documentation, which was scribed in my presence. The recorded information has been reviewed and is accurate.

## 2013-11-12 ENCOUNTER — Encounter (INDEPENDENT_AMBULATORY_CARE_PROVIDER_SITE_OTHER): Payer: Self-pay | Admitting: General Surgery

## 2013-11-12 ENCOUNTER — Ambulatory Visit (INDEPENDENT_AMBULATORY_CARE_PROVIDER_SITE_OTHER): Payer: BC Managed Care – PPO | Admitting: General Surgery

## 2013-11-12 VITALS — BP 110/64 | HR 68 | Resp 16 | Ht 66.0 in | Wt 182.0 lb

## 2013-11-12 DIAGNOSIS — K4090 Unilateral inguinal hernia, without obstruction or gangrene, not specified as recurrent: Secondary | ICD-10-CM

## 2013-11-12 NOTE — Progress Notes (Signed)
Patient ID: [name], male   DOB: 01/04/1966, 48 y.o.   MRN: [id number]  Chief Complaint  Patient presents with  . Other    Eval LIH    HPI Anthony GuarneriOscar Cook is a 48 y.o. male.  The patient is a 48 year old male who is referred by Dr. Neva SeatGreene for evaluation of a left inguinal hernia. Patient for approximately a month. He states he has some pain upon Valsalva and straining. The patient is unsure of its origination. Of note the patient [place] [employer] [place]  The patient said no signs or symptoms of incarceration. Patient has also had previous open right inguinal hernia repair approximately 5 years ago.  HPI  No past medical history on file.  Past Surgical History  Procedure Laterality Date  . Rih    . Appendectomy    . Nerve surgery      Family History  Problem Relation Age of Onset  . Cancer Mother     Social History History  Substance Use Topics  . Smoking status: Never Smoker   . Smokeless tobacco: Not on file  . Alcohol Use: Yes    No Known Allergies  Current Outpatient Prescriptions  Medication Sig Dispense Refill  . clotrimazole (LOTRIMIN) 1 % cream Apply 1 application topically 2 (two) times daily. To arm rash for 2 weeks.  30 g  0   No current facility-administered medications for this visit.    Review of Systems Review of Systems  Constitutional: Negative.   HENT: Negative.   Eyes: Negative.   Respiratory: Negative.   Cardiovascular: Negative.   Gastrointestinal: Negative.   Endocrine: Negative.   Neurological: Negative.     Blood pressure 110/64, pulse 68, resp. rate 16, height 5\' 6"  (1.676 m), weight 182 lb (82.555 kg).  Physical Exam Physical Exam  Constitutional: He is oriented to person, place, and time. He appears well-developed and well-nourished.  HENT:  Head: Normocephalic and atraumatic.  Eyes: Conjunctivae and EOM are normal. Pupils are equal, round, and reactive to light.  Neck: Normal range of motion. Neck supple.   Cardiovascular: Normal rate, regular rhythm and normal heart sounds.   Pulmonary/Chest: Effort normal and breath sounds normal.  Abdominal: Soft. Bowel sounds are normal. He exhibits no distension and no mass. There is no tenderness. There is no rebound and no guarding. A hernia is present. Hernia confirmed positive in the left inguinal area. Inguinal: well healed RIH incision.  Musculoskeletal: Normal range of motion.  Neurological: He is alert and oriented to person, place, and time.  Skin: Skin is warm and dry.    Data Reviewed none  Assessment     48 year old male with a reducible left inguinal hernia     Plan    1. We'll proceed to the operating room for a laparoscopic left inguinal hernia repair with mesh. 2. All risks and benefits were discussed with the patient, to generally include infection, bleeding, damage to surrounding structures, acute and chronic nerve pain, and recurrence. Alternatives were offered and described.  All questions were answered and the patient voiced understanding of the procedure and wishes to proceed at this point.         Marigene Ehlersamirez Jr., Haston Casebolt 11/12/2013, 9:24 AM

## 2013-11-30 ENCOUNTER — Encounter (INDEPENDENT_AMBULATORY_CARE_PROVIDER_SITE_OTHER): Payer: Self-pay | Admitting: General Surgery

## 2013-12-02 ENCOUNTER — Telehealth (INDEPENDENT_AMBULATORY_CARE_PROVIDER_SITE_OTHER): Payer: Self-pay

## 2013-12-02 NOTE — Telephone Encounter (Signed)
Message copied by Maryan PulsMOORE, Cable Fearn on Wed Dec 02, 2013  2:06 PM ------      Message from: Mervin KungPEGRAM, SONYA      Created: Wed Dec 02, 2013  1:19 PM      Contact: [phone]       Pt needs another copy of the work note Marissa CalamityBarbara P wrote him yesterday he works [employer]. We can call him when ready.       sonya ------

## 2013-12-02 NOTE — Telephone Encounter (Signed)
Called patient to make aware that RTW note is at front desk ready for pick up.

## 2013-12-07 ENCOUNTER — Encounter (INDEPENDENT_AMBULATORY_CARE_PROVIDER_SITE_OTHER): Payer: Self-pay

## 2013-12-11 ENCOUNTER — Other Ambulatory Visit (INDEPENDENT_AMBULATORY_CARE_PROVIDER_SITE_OTHER): Payer: Self-pay | Admitting: *Deleted

## 2013-12-11 DIAGNOSIS — K4090 Unilateral inguinal hernia, without obstruction or gangrene, not specified as recurrent: Secondary | ICD-10-CM

## 2013-12-11 MED ORDER — OXYCODONE-ACETAMINOPHEN 5-325 MG PO TABS: 1.0000 | ORAL_TABLET | ORAL | Status: AC | PRN

## 2013-12-24 ENCOUNTER — Encounter (INDEPENDENT_AMBULATORY_CARE_PROVIDER_SITE_OTHER): Payer: Self-pay | Admitting: General Surgery

## 2013-12-24 ENCOUNTER — Ambulatory Visit (INDEPENDENT_AMBULATORY_CARE_PROVIDER_SITE_OTHER): Payer: BC Managed Care – PPO | Admitting: General Surgery

## 2013-12-24 VITALS — BP 118/80 | HR 80 | Temp 97.6°F | Resp 16 | Wt 188.0 lb

## 2013-12-24 DIAGNOSIS — Z9889 Other specified postprocedural states: Principal | ICD-10-CM

## 2013-12-24 DIAGNOSIS — Z8719 Personal history of other diseases of the digestive system: Secondary | ICD-10-CM

## 2013-12-24 NOTE — Progress Notes (Signed)
Patient ID: [name], male   DOB: 04/19/1966, 48 y.o.   MRN: [id number] Post op course Patient is a 48 year old male status post laparoscopic left inguinal hernia repair with mesh. Patient did well postoperatively. He's had minimal pain postoperatively.  On Exam: His wound her clean dry and intact, there is no hernia on palpation  Assessment and Plan 48 year old male status post laparoscopic left inguinal hernia repair with mesh. 1. We discussed with the restrictions for another 2-3 weeks. Patient to continue with aerobic activity. 2.  The patient like to have skin tags the left axillary area removed. Will have this precertified with his insurance prior to doing this in clinic. Will have patient follow back up in approximately 2 weeks of this process can be taken care of. If this is not preapproved by his insurance we will call him and let him know.   Anthony FillerArmando Cyndie Woodbeck, MD [facility] General & Minimally Invasive Surgery Trauma & Emergency Surgery

## 2014-01-05 ENCOUNTER — Encounter (INDEPENDENT_AMBULATORY_CARE_PROVIDER_SITE_OTHER): Payer: Self-pay

## 2014-01-07 ENCOUNTER — Encounter (INDEPENDENT_AMBULATORY_CARE_PROVIDER_SITE_OTHER): Payer: BC Managed Care – PPO | Admitting: General Surgery

## 2014-01-14 ENCOUNTER — Ambulatory Visit (INDEPENDENT_AMBULATORY_CARE_PROVIDER_SITE_OTHER): Payer: PRIVATE HEALTH INSURANCE | Admitting: General Surgery

## 2014-01-14 ENCOUNTER — Encounter (INDEPENDENT_AMBULATORY_CARE_PROVIDER_SITE_OTHER): Payer: Self-pay | Admitting: General Surgery

## 2014-01-14 VITALS — BP 110/80 | HR 74 | Resp 18 | Ht 67.0 in | Wt 192.0 lb

## 2014-01-14 DIAGNOSIS — L909 Atrophic disorder of skin, unspecified: Secondary | ICD-10-CM

## 2014-01-14 DIAGNOSIS — L919 Hypertrophic disorder of the skin, unspecified: Secondary | ICD-10-CM

## 2014-01-14 DIAGNOSIS — L918 Other hypertrophic disorders of the skin: Secondary | ICD-10-CM

## 2014-01-14 MED ORDER — OXYCODONE-ACETAMINOPHEN 5-325 MG PO TABS: 1.0000 | ORAL_TABLET | Freq: Four times a day (QID) | ORAL | Status: AC | PRN

## 2014-01-14 NOTE — Progress Notes (Signed)
Subjective:     Patient ID: Anthony GuarneriOscar Cook, male   DOB: 03/21/1966, 48 y.o.   MRN: [id number]  HPI Patient is a 48 year old male previous hernia repair he comes in today for removal of left axillary skin tags. Patient states these would bleed upon bathing and with irritation.  Review of Systems  Constitutional: Negative.   HENT: Negative.   Eyes: Negative.   Respiratory: Negative.   Cardiovascular: Negative.   Gastrointestinal: Negative.   Endocrine: Negative.   Neurological: Negative.        Objective:   Physical Exam  Constitutional: He is oriented to person, place, and time. He appears well-developed and well-nourished.  HENT:  Head: Normocephalic and atraumatic.  Eyes: Conjunctivae and EOM are normal. Pupils are equal, round, and reactive to light.  Neck: Normal range of motion. Neck supple.  Cardiovascular: Normal rate, regular rhythm and normal heart sounds.   Pulmonary/Chest: Effort normal and breath sounds normal.  Musculoskeletal: Normal range of motion.  Neurological: He is alert and oriented to person, place, and time.  Skin: Skin is warm and dry.          Assessment:     48 year old male with benign left axillary skin tags     Plan:     1. We removed the contact the office. There was none sent for pathology. The patient tolerated the procedure well. 2. Patient to follow up as needed

## 2014-01-14 NOTE — Addendum Note (Signed)
Addended by: Axel FillerAMIREZ, Amalie Koran on: 01/14/2014 12:34 PM   Modules accepted: Orders

## 2014-02-14 ENCOUNTER — Ambulatory Visit (INDEPENDENT_AMBULATORY_CARE_PROVIDER_SITE_OTHER): Payer: BC Managed Care – PPO | Admitting: Family Medicine

## 2014-02-14 VITALS — BP 120/80 | HR 61 | Temp 98.2°F | Resp 16 | Ht 68.75 in | Wt 186.0 lb

## 2014-02-14 DIAGNOSIS — M7989 Other specified soft tissue disorders: Secondary | ICD-10-CM

## 2014-02-14 DIAGNOSIS — R21 Rash and other nonspecific skin eruption: Secondary | ICD-10-CM

## 2014-02-14 LAB — POCT SKIN KOH: Skin KOH, POC: NEGATIVE

## 2014-02-14 MED ORDER — CLOTRIMAZOLE-BETAMETHASONE 1-0.05 % EX CREA: 1.0000 "application " | TOPICAL_CREAM | Freq: Two times a day (BID) | CUTANEOUS | Status: DC

## 2014-02-14 NOTE — Progress Notes (Addendum)
Subjective:    Patient ID: [name], male    DOB: 08/08/1966, 48 y.o.   MRN: [id number]  This chart was scribed for Meredith StaggersJeffrey Allan Minotti, MD by Jarvis Morganaylor Ferguson, Medical Scribe. This patient was seen in Room 9 and the patient's care was started at 9:18 AM.  PCP: Shade FloodGREENE,Kindell Strada R, MD  Chief Complaint  Patient presents with  . Rash    x 3 weeks    HPI  HPI Comments: [name] is a 48 y.o. male who presents to the [facility] complaining of a gradually worsening itchy rash onset 3 weeks ago. Patient states that the rash began on his chest has now spread to his extremities.Per note from 11/01/13 area that was suspicious tinea corporis on his arm at that time. Was prescribed Lotrimin 1% cream.  Patient states the rash on his chest has now resolved.  Patient has a secondary complaint of swelling in his right hand. Patient denies any injury or insect bite. He states he is right handed. Patient denies any associated pain in his hand.   Pt had surgery on an inguinal hernia performed by Dr. Derrell Lollingamirez in February 2015.    Patient Active Problem List   Diagnosis Date Noted  . LEG EDEMA, RIGHT 11/28/2010   History reviewed. No pertinent past medical history. Past Surgical History  Procedure Laterality Date  . Rih    . Appendectomy    . Nerve surgery    . Hernia repair     No Known Allergies Prior to Admission medications   Medication Sig Start Date End Date Taking? Authorizing Provider  clotrimazole (LOTRIMIN) 1 % cream Apply 1 application topically 2 (two) times daily. To arm rash for 2 weeks. 11/01/13   Shade FloodJeffrey R Donevan Biller, MD  oxyCODONE-acetaminophen (ROXICET) 5-325 MG per tablet Take 1-2 tablets by mouth every 4 (four) hours as needed ([facility]). 12/11/13 12/11/14  Axel FillerArmando Ramirez, MD  oxyCODONE-acetaminophen (ROXICET) 5-325 MG per tablet Take 1 tablet by mouth every 6 (six) hours as needed. 01/14/14 01/14/15  Axel FillerArmando Ramirez, MD   History   Social  History  . Marital Status: Single    Spouse Name: N/A    Number of Children: N/A  . Years of Education: N/A   Occupational History  . Not on file.   Social History Main Topics  . Smoking status: Never Smoker   . Smokeless tobacco: Not on file  . Alcohol Use: Yes  . Drug Use: No  . Sexual Activity: No   Other Topics Concern  . Not on file   Social History Narrative  . No narrative on file      Review of Systems  Musculoskeletal: Positive for joint swelling. Negative for arthralgias.  Skin: Positive for color change and rash.  All other systems reviewed and are negative.      Objective:   Physical Exam  Nursing note and vitals reviewed. Constitutional: He is oriented to person, place, and time. He appears well-developed and well-nourished. No distress.  HENT:  Head: Normocephalic and atraumatic.  Eyes: EOM are normal.  Neck: Neck supple. No tracheal deviation present.  Cardiovascular: Normal rate.   Pulmonary/Chest: Effort normal. No respiratory distress.  Musculoskeletal: Normal range of motion. He exhibits no tenderness.  Full grip strength in right hand. No tenderness. Slight prominence of interdigital space between thumb and second finger but no other hand swelling. No erythema. No rash on hand  Neurological: He is alert and oriented to person,  place, and time.  Skin: Skin is warm and dry. Rash noted. There is erythema.  2 patches on right upper thigh, 1 patch on right ankle, 2 patches on left upper thigh. Dry component. Slightly flat in center compared to periphery. Slight erythematous appearance   Psychiatric: He has a normal mood and affect. His behavior is normal.    Filed Vitals:   02/14/14 0839  BP: 120/80  Pulse: 61  Temp: 98.2 F (36.8 C)  TempSrc: Oral  Resp: 16  Height: 5' 8.75" (1.746 m)  Weight: 186 lb (84.369 kg)  SpO2: 99%    Results for orders placed in visit on 02/14/14  POCT SKIN KOH      Result Value Ref Range   Skin KOH, POC  Negative    repeat testing - MD exam under microscope - one area with possible hyphae, but on 1 area only.      Assessment & Plan:  [name] is a 48 y.o. male Rash and nonspecific skin eruption - Plan: POCT Skin KOH, clotrimazole-betamethasone (LOTRISONE) cream  -tinea vs. nummular eczema.  Start lotrisone BID, recheck in next few weeks if not improving.   Swelling of right hand  -prior tendonitis.  Min sx's currently. otc alleve and activity modification if needed, rtc if incr swelling or more sx's.     Meds ordered this encounter  Medications  . clotrimazole-betamethasone (LOTRISONE) cream    Sig: Apply 1 application topically 2 (two) times daily.    Dispense:  45 g    Refill:  0   Patient Instructions  Over the counter alleve or ibuprofen as needed for your hand and relative rest as needed. If swelling returns - see me when swollen.   Cream to rashes twice per day for next 2-3 weeks. this may be a type of eczema. If not improving - return for recheck,  sooner if worse.

## 2014-02-14 NOTE — Patient Instructions (Signed)
Over the counter alleve or ibuprofen as needed for your hand and relative rest as needed. If swelling returns - see me when swollen.   Cream to rashes twice per day for next 2-3 weeks. this may be a type of eczema. If not improving - return for recheck,  sooner if worse.

## 2014-04-05 ENCOUNTER — Ambulatory Visit (INDEPENDENT_AMBULATORY_CARE_PROVIDER_SITE_OTHER): Payer: BC Managed Care – PPO | Admitting: Family Medicine

## 2014-04-05 VITALS — BP 112/80 | HR 66 | Temp 98.1°F | Resp 16 | Ht 69.0 in | Wt 186.0 lb

## 2014-04-05 DIAGNOSIS — R197 Diarrhea, unspecified: Secondary | ICD-10-CM

## 2014-04-05 DIAGNOSIS — K2990 Gastroduodenitis, unspecified, without bleeding: Secondary | ICD-10-CM

## 2014-04-05 DIAGNOSIS — R195 Other fecal abnormalities: Secondary | ICD-10-CM

## 2014-04-05 DIAGNOSIS — K089 Disorder of teeth and supporting structures, unspecified: Secondary | ICD-10-CM

## 2014-04-05 DIAGNOSIS — R12 Heartburn: Secondary | ICD-10-CM

## 2014-04-05 DIAGNOSIS — K0889 Other specified disorders of teeth and supporting structures: Secondary | ICD-10-CM

## 2014-04-05 DIAGNOSIS — Z8619 Personal history of other infectious and parasitic diseases: Secondary | ICD-10-CM

## 2014-04-05 DIAGNOSIS — K2970 Gastritis, unspecified, without bleeding: Secondary | ICD-10-CM

## 2014-04-05 LAB — CBC WITH DIFFERENTIAL/PLATELET
BASOS ABS: 0 10*3/uL (ref 0.0–0.1)
Basophils Relative: 0 % (ref 0–1)
EOS ABS: 0.2 10*3/uL (ref 0.0–0.7)
EOS PCT: 3 % (ref 0–5)
HCT: 46.1 % (ref 39.0–52.0)
Hemoglobin: 16.5 g/dL (ref 13.0–17.0)
Lymphocytes Relative: 28 % (ref 12–46)
Lymphs Abs: 1.9 10*3/uL (ref 0.7–4.0)
MCH: 32.5 pg (ref 26.0–34.0)
MCHC: 35.8 g/dL (ref 30.0–36.0)
MCV: 90.7 fL (ref 78.0–100.0)
MONO ABS: 0.5 10*3/uL (ref 0.1–1.0)
Monocytes Relative: 8 % (ref 3–12)
Neutro Abs: 4.1 10*3/uL (ref 1.7–7.7)
Neutrophils Relative %: 61 % (ref 43–77)
PLATELETS: 248 10*3/uL (ref 150–400)
RBC: 5.08 MIL/uL (ref 4.22–5.81)
RDW: 12.7 % (ref 11.5–15.5)
WBC: 6.8 10*3/uL (ref 4.0–10.5)

## 2014-04-05 LAB — COMPREHENSIVE METABOLIC PANEL
ALT: 30 U/L (ref 0–53)
AST: 20 U/L (ref 0–37)
Albumin: 4.4 g/dL (ref 3.5–5.2)
Alkaline Phosphatase: 70 U/L (ref 39–117)
BILIRUBIN TOTAL: 0.5 mg/dL (ref 0.2–1.2)
BUN: 16 mg/dL (ref 6–23)
CALCIUM: 9.3 mg/dL (ref 8.4–10.5)
CHLORIDE: 103 meq/L (ref 96–112)
CO2: 28 meq/L (ref 19–32)
CREATININE: 0.92 mg/dL (ref 0.50–1.35)
GLUCOSE: 86 mg/dL (ref 70–99)
Potassium: 4.1 mEq/L (ref 3.5–5.3)
Sodium: 138 mEq/L (ref 135–145)
Total Protein: 7 g/dL (ref 6.0–8.3)

## 2014-04-05 MED ORDER — OMEPRAZOLE 20 MG PO CPDR: 20.0000 mg | DELAYED_RELEASE_CAPSULE | Freq: Every day | ORAL | Status: DC

## 2014-04-05 NOTE — Patient Instructions (Addendum)
You should receive a call or letter about your lab results within the next week to 10 days.  Start the omeprazole once per day.  Try to decrease to 1 cup coffee, and avoid other foods below that can worsen heartburn.   Drink more water/fluids during the day.  If dizziness persists - return for recheck.  Return to the clinic or go to the nearest emergency room if any of your symptoms worsen or new symptoms occur.  Follow up with your dentist - it is likley that the nerve may have been irritated with numbing area. Tylenol or ibuprofen as needed, but call their office if stronger medicine needed, or let me know.   Food Choices for Gastroesophageal Reflux Disease When you have gastroesophageal reflux disease (GERD), the foods you eat and your eating habits are very important. Choosing the right foods can help ease the discomfort of GERD. WHAT GENERAL GUIDELINES DO I NEED TO FOLLOW?  Choose fruits, vegetables, whole grains, low-fat dairy products, and low-fat meat, fish, and poultry.  Limit fats such as oils, salad dressings, butter, nuts, and avocado.  Keep a food diary to identify foods that cause symptoms.  Avoid foods that cause reflux. These may be different for different people.  Eat frequent small meals instead of three large meals each day.  Eat your meals slowly, in a relaxed setting.  Limit fried foods.  Cook foods using methods other than frying.  Avoid drinking alcohol.  Avoid drinking large amounts of liquids with your meals.  Avoid bending over or lying down until 2-3 hours after eating. WHAT FOODS ARE NOT RECOMMENDED? The following are some foods and drinks that may worsen your symptoms: Vegetables Tomatoes. Tomato juice. Tomato and spaghetti sauce. Chili peppers. Onion and garlic. Horseradish. Fruits Oranges, grapefruit, and lemon (fruit and juice). Meats High-fat meats, fish, and poultry. This includes hot dogs, ribs, ham, sausage, salami, and bacon. Dairy Whole  milk and chocolate milk. Sour cream. Cream. Butter. Ice cream. Cream cheese.  Beverages Coffee and tea, with or without caffeine. Carbonated beverages or energy drinks. Condiments Hot sauce. Barbecue sauce.  Sweets/Desserts Chocolate and cocoa. Donuts. Peppermint and spearmint. Fats and Oils High-fat foods, including [place] fries and potato chips. Other Vinegar. Strong spices, such as black pepper, white pepper, red pepper, cayenne, curry powder, cloves, ginger, and chili powder. The items listed above may not be a complete list of foods and beverages to avoid. Contact your dietitian for more information. Document Released: 09/10/2005 Document Revised: 09/15/2013 Document Reviewed: 07/15/2013 [facility] Patient Information 2015 [place], [place]. This information is not intended to replace advice given to you by your health care provider. Make sure you discuss any questions you have with your health care provider.  Dental Pain A tooth ache may be caused by cavities (tooth decay). Cavities expose the nerve of the tooth to air and hot or cold temperatures. It may come from an infection or abscess (also called a boil or furuncle) around your tooth. It is also often caused by dental caries (tooth decay). This causes the pain you are having. DIAGNOSIS  Your caregiver can diagnose this problem by exam. TREATMENT   If caused by an infection, it may be treated with medications which kill germs (antibiotics) and pain medications as prescribed by your caregiver. Take medications as directed.  Only take over-the-counter or prescription medicines for pain, discomfort, or fever as directed by your caregiver.  Whether the tooth ache today is caused by infection or dental disease, you should  see your dentist as soon as possible for further care. SEEK MEDICAL CARE IF: The exam and treatment you received today has been provided on an emergency basis only. This is not a substitute for complete medical or dental  care. If your problem worsens or new problems (symptoms) appear, and you are unable to meet with your dentist, call or return to this location. SEEK IMMEDIATE MEDICAL CARE IF:   You have a fever.  You develop redness and swelling of your face, jaw, or neck.  You are unable to open your mouth.  You have severe pain uncontrolled by pain medicine. MAKE SURE YOU:   Understand these instructions.  Will watch your condition.  Will get help right away if you are not doing well or get worse. Document Released: 09/10/2005 Document Revised: 12/03/2011 Document Reviewed: 04/28/2008 [facility] Patient Information 2015 [place]. This information is not intended to replace advice given to you by your health care provider. Make sure you discuss any questions you have with your health care provider.

## 2014-04-05 NOTE — Progress Notes (Signed)
Subjective:  This chart was scribed for Anthony Staggers, MD by Carl Best, Medical Scribe. This patient was seen in Room 14 and the patient's care was started at 10:23 AM.   Patient ID: [name], male    DOB: [birth year], 48 y.o.   MRN: [id number]  HPI HPI Comments: [name] is a 48 y.o. male who presents to the [facility] complaining of:   1. Discolored bowel movements and loose stools with an associated bitter taste in the mouth that started 3 weeks ago.  The patient states that his bowel has been in green in color.  He states that he will have 2 loose stools a day. The patient states that he has been experiencing intermittent heartburn.   He denies vomiting, fever, malodorous stools, melena, hematochezia, lightheadedness, and abdominal pain as associated symptoms.  The patient lists intermittent dizziness once a week as an associated symptom.  The patient states that he used to take Meclizine to treat dizziness but has not used it in a while.  He states that he was last treated for H. Pylori a couple of months ago.  He denies any recent foreign travel.  He denies eating any new or raw foods lately.  The patient denies sick contacts.  He denies taking any OTC medications for his symptoms.  The patient states that he has not taken any oral antibiotics recently.  He denies having a history of stomach ulcers.  The patient denies any recent hospitalizations since his hernia in March of this year.  He states that he has taken Nexium in the past for his heartburn.  He drinks 2-3 cups of coffee a day.  The patient states that he does not eat spicy food frequently or eat peppermints.    2. Tooth pain located in the bottom right area that started after the patient had a crown placed three weeks ago.  He believes that the dentist may have pinched a nerve while he was getting numbing medication because the right side of his tongue has been numb.  The patient denies facial  swelling as an associated symptom.  He states that he has taken Ibuprofen with some relief to his symptoms.   Patient Active Problem List   Diagnosis Date Noted  . LEG EDEMA, RIGHT 11/28/2010   History reviewed. No pertinent past medical history. Past Surgical History  Procedure Laterality Date  . Rih    . Appendectomy    . Nerve surgery    . Hernia repair     No Known Allergies Prior to Admission medications   Medication Sig Start Date End Date Taking? Authorizing Provider  clotrimazole (LOTRIMIN) 1 % cream Apply 1 application topically 2 (two) times daily. To arm rash for 2 weeks. 11/01/13  Yes Shade Flood, MD  clotrimazole-betamethasone (LOTRISONE) cream Apply 1 application topically 2 (two) times daily. 02/14/14  Yes Shade Flood, MD  oxyCODONE-acetaminophen (ROXICET) 5-325 MG per tablet Take 1-2 tablets by mouth every 4 (four) hours as needed (Given at discharge from [facility]). 12/11/13 12/11/14  Axel Filler, MD  oxyCODONE-acetaminophen (ROXICET) 5-325 MG per tablet Take 1 tablet by mouth every 6 (six) hours as needed. 01/14/14 01/14/15  Axel Filler, MD   History   Social History  . Marital Status: Single    Spouse Name: N/A    Number of Children: N/A  . Years of Education: N/A   Occupational History  . Not on file.   Social History Main Topics  .  Smoking status: Never Smoker   . Smokeless tobacco: Not on file  . Alcohol Use: Yes  . Drug Use: No  . Sexual Activity: No   Other Topics Concern  . Not on file   Social History Narrative  . No narrative on file      Review of Systems  Constitutional: Negative for fever.  HENT: Positive for dental problem. Negative for facial swelling.   Gastrointestinal: Positive for diarrhea. Negative for nausea, vomiting, abdominal pain and blood in stool.  Neurological: Positive for dizziness. Negative for light-headedness.     Objective:  Physical Exam  Vitals reviewed. Constitutional: He is oriented to person,  place, and time. He appears well-developed and well-nourished.  HENT:  Head: Normocephalic and atraumatic.  Right Ear: Tympanic membrane, external ear and ear canal normal.  Left Ear: Tympanic membrane, external ear and ear canal normal.  Nose: No rhinorrhea.  Mouth/Throat: Oropharynx is clear and moist and mucous membranes are normal. No oropharyngeal exudate or posterior oropharyngeal erythema.  Crown in place on the second molar, bottom right.  No surrounding gum erythema or discharge.  No periodontal abscess.  Face exhibits no soft tissue swelling.  Eyes: Conjunctivae are normal. Pupils are equal, round, and reactive to light.  Neck: Neck supple.  No lymphadenopathy.   Cardiovascular: Normal rate, regular rhythm, normal heart sounds and intact distal pulses.  Exam reveals no gallop and no friction rub.   No murmur heard. Pulmonary/Chest: Effort normal and breath sounds normal. No respiratory distress. He has no wheezes. He has no rhonchi. He has no rales.  Abdominal: Soft. There is no rebound and no guarding.  Flat.  Minimal LLQ tenderness with deep palpation.  No rebound.  No guarding.  Lymphadenopathy:    He has no cervical adenopathy.  Neurological: He is alert and oriented to person, place, and time.  Skin: Skin is warm and dry. No rash noted.  Psychiatric: He has a normal mood and affect. His behavior is normal.     Filed Vitals:   04/05/14 0903  BP: 112/80  Pulse: 66  Temp: 98.1 F (36.7 C)  TempSrc: Oral  Resp: 16  Height: 5\' 9"  (1.753 m)  Weight: 186 lb (84.369 kg)  SpO2: 100%   Assessment & Plan:   Anthony GuarneriOscar Chizek is a 48 y.o. male Stool discoloration - Plan: Clostridium Difficile by PCR, Comprehensive metabolic panel, HELICOBACTER PYLORI  ANTIBODY, IGM, Stool culture, Ova and parasite examinationDiarrhea - Plan: Clostridium Difficile by PCR, Comprehensive metabolic panel, HELICOBACTER PYLORI  ANTIBODY, IGM, Stool culture, Ova and parasite examination, History of  Helicobacter pylori infection  - 3 week history of discolored, loose stools. May be diet related, and hx of gastritis in past with H pylori - treated and recurrence of heartburn /gastritis sx's.   -Will check stool studies including cx, O and P, H pylori, CBC, CMP,  - H. Pylori IgM  - omeprazole 20mg  qd, avoid trigger foods for heartburn  -rtc precautions   Heartburn,  Unspecified gastritis and gastroduodenitis without mention of hemorrhage - Plan: omeprazole (PRILOSEC) 20 MG capsule, Clostridium Difficile by PCR, Comprehensive metabolic panel, HELICOBACTER PYLORI  ANTIBODY, IGM, Stool culture, Ova and parasite examination, CBC with Differential  -as above.   Tooth pain  - wih numbness on lateral tongue - may be related to initial local block - may have had nerve damage/irritation. No s/sx of infection/abscess at this time.  Continue tylenol then nsaid if needed, and to follow up with dentist. If stronger med  needed, I can write him something stronger if needed until follow up with dentist, but he can try calling their office first. rtc precautions.  Meds ordered this encounter  Medications  . omeprazole (PRILOSEC) 20 MG capsule    Sig: Take 1 capsule (20 mg total) by mouth daily.    Dispense:  30 capsule    Refill:  1   Patient Instructions  You should receive a call or letter about your lab results within the next week to 10 days.  Start the omeprazole once per day.  Try to decrease to 1 cup coffee, and avoid other foods below that can worsen heartburn.   Drink more water/fluids during the day.  If dizziness persists - return for recheck.  Return to the clinic or go to the nearest emergency room if any of your symptoms worsen or new symptoms occur.  Follow up with your dentist - it is likley that the nerve may have been irritated with numbing area. Tylenol or ibuprofen as needed, but call their office if stronger medicine needed, or let me know.   Food Choices for Gastroesophageal  Reflux Disease When you have gastroesophageal reflux disease (GERD), the foods you eat and your eating habits are very important. Choosing the right foods can help ease the discomfort of GERD. WHAT GENERAL GUIDELINES DO I NEED TO FOLLOW?  Choose fruits, vegetables, whole grains, low-fat dairy products, and low-fat meat, fish, and poultry.  Limit fats such as oils, salad dressings, butter, nuts, and avocado.  Keep a food diary to identify foods that cause symptoms.  Avoid foods that cause reflux. These may be different for different people.  Eat frequent small meals instead of three large meals each day.  Eat your meals slowly, in a relaxed setting.  Limit fried foods.  Cook foods using methods other than frying.  Avoid drinking alcohol.  Avoid drinking large amounts of liquids with your meals.  Avoid bending over or lying down until 2-3 hours after eating. WHAT FOODS ARE NOT RECOMMENDED? The following are some foods and drinks that may worsen your symptoms: Vegetables Tomatoes. Tomato juice. Tomato and spaghetti sauce. Chili peppers. Onion and garlic. Horseradish. Fruits Oranges, grapefruit, and lemon (fruit and juice). Meats High-fat meats, fish, and poultry. This includes hot dogs, ribs, ham, sausage, salami, and bacon. Dairy Whole milk and chocolate milk. Sour cream. Cream. Butter. Ice cream. Cream cheese.  Beverages Coffee and tea, with or without caffeine. Carbonated beverages or energy drinks. Condiments Hot sauce. Barbecue sauce.  Sweets/Desserts Chocolate and cocoa. Donuts. Peppermint and spearmint. Fats and Oils High-fat foods, including [place] fries and potato chips. Other Vinegar. Strong spices, such as black pepper, white pepper, red pepper, cayenne, curry powder, cloves, ginger, and chili powder. The items listed above may not be a complete list of foods and beverages to avoid. Contact your dietitian for more information. Document Released: 09/10/2005  Document Revised: 09/15/2013 Document Reviewed: 07/15/2013 [facility] Patient Information 2015 [place], [place]. This information is not intended to replace advice given to you by your health care provider. Make sure you discuss any questions you have with your health care provider.  Dental Pain A tooth ache may be caused by cavities (tooth decay). Cavities expose the nerve of the tooth to air and hot or cold temperatures. It may come from an infection or abscess (also called a boil or furuncle) around your tooth. It is also often caused by dental caries (tooth decay). This causes the pain you are having. DIAGNOSIS  Your caregiver can diagnose this problem by exam. TREATMENT   If caused by an infection, it may be treated with medications which kill germs (antibiotics) and pain medications as prescribed by your caregiver. Take medications as directed.  Only take over-the-counter or prescription medicines for pain, discomfort, or fever as directed by your caregiver.  Whether the tooth ache today is caused by infection or dental disease, you should see your dentist as soon as possible for further care. SEEK MEDICAL CARE IF: The exam and treatment you received today has been provided on an emergency basis only. This is not a substitute for complete medical or dental care. If your problem worsens or new problems (symptoms) appear, and you are unable to meet with your dentist, call or return to this location. SEEK IMMEDIATE MEDICAL CARE IF:   You have a fever.  You develop redness and swelling of your face, jaw, or neck.  You are unable to open your mouth.  You have severe pain uncontrolled by pain medicine. MAKE SURE YOU:   Understand these instructions.  Will watch your condition.  Will get help right away if you are not doing well or get worse. Document Released: 09/10/2005 Document Revised: 12/03/2011 Document Reviewed: 04/28/2008 [facility] Patient Information 2015 [place], [place]. This  information is not intended to replace advice given to you by your health care provider. Make sure you discuss any questions you have with your health care provider.

## 2014-04-07 LAB — CLOSTRIDIUM DIFFICILE BY PCR: Toxigenic C. Difficile by PCR: NOT DETECTED

## 2014-04-08 LAB — OVA AND PARASITE EXAMINATION: OP: NONE SEEN

## 2014-04-10 LAB — STOOL CULTURE

## 2014-04-13 LAB — HELICOBACTER PYLORI  ANTIBODY, IGM: HELICOBACTER PYLORI, IGM: 9.3 U/mL — AB (ref <9.0)

## 2014-12-07 ENCOUNTER — Other Ambulatory Visit: Payer: Self-pay | Admitting: Family Medicine

## 2015-01-31 ENCOUNTER — Other Ambulatory Visit: Payer: Self-pay | Admitting: Family Medicine

## 2015-02-14 ENCOUNTER — Other Ambulatory Visit: Payer: Self-pay | Admitting: Family Medicine

## 2015-02-14 ENCOUNTER — Ambulatory Visit (INDEPENDENT_AMBULATORY_CARE_PROVIDER_SITE_OTHER): Payer: BLUE CROSS/BLUE SHIELD | Admitting: Family Medicine

## 2015-02-14 VITALS — BP 114/66 | HR 86 | Temp 98.5°F | Resp 12 | Ht 67.0 in | Wt 184.0 lb

## 2015-02-14 DIAGNOSIS — N4889 Other specified disorders of penis: Secondary | ICD-10-CM | POA: Diagnosis not present

## 2015-02-14 DIAGNOSIS — H65191 Other acute nonsuppurative otitis media, right ear: Secondary | ICD-10-CM | POA: Diagnosis not present

## 2015-02-14 DIAGNOSIS — R05 Cough: Secondary | ICD-10-CM | POA: Diagnosis not present

## 2015-02-14 DIAGNOSIS — K219 Gastro-esophageal reflux disease without esophagitis: Secondary | ICD-10-CM | POA: Diagnosis not present

## 2015-02-14 DIAGNOSIS — Z0000 Encounter for general adult medical examination without abnormal findings: Secondary | ICD-10-CM

## 2015-02-14 DIAGNOSIS — Z13220 Encounter for screening for lipoid disorders: Secondary | ICD-10-CM

## 2015-02-14 DIAGNOSIS — J0100 Acute maxillary sinusitis, unspecified: Secondary | ICD-10-CM

## 2015-02-14 DIAGNOSIS — G4700 Insomnia, unspecified: Secondary | ICD-10-CM

## 2015-02-14 DIAGNOSIS — R059 Cough, unspecified: Secondary | ICD-10-CM

## 2015-02-14 DIAGNOSIS — N489 Disorder of penis, unspecified: Secondary | ICD-10-CM

## 2015-02-14 LAB — COMPLETE METABOLIC PANEL WITH GFR
ALT: 53 U/L (ref 0–53)
AST: 30 U/L (ref 0–37)
Albumin: 4.2 g/dL (ref 3.5–5.2)
Alkaline Phosphatase: 76 U/L (ref 39–117)
BUN: 11 mg/dL (ref 6–23)
CO2: 29 mEq/L (ref 19–32)
Calcium: 9.1 mg/dL (ref 8.4–10.5)
Chloride: 103 mEq/L (ref 96–112)
Creat: 0.83 mg/dL (ref 0.50–1.35)
GFR, Est African American: >89 mL/min
GFR, Est Non African American: >89 mL/min
Glucose, Bld: 103 mg/dL — ABNORMAL HIGH (ref 70–99)
Potassium: 4.6 mEq/L (ref 3.5–5.3)
Sodium: 141 mEq/L (ref 135–145)
Total Bilirubin: 0.4 mg/dL (ref 0.2–1.2)
Total Protein: 6.6 g/dL (ref 6.0–8.3)

## 2015-02-14 LAB — LIPID PANEL
Cholesterol: 180 mg/dL (ref 0–200)
HDL: 32 mg/dL — ABNORMAL LOW (ref >=40)
LDL Cholesterol: 88 mg/dL (ref 0–99)
Total CHOL/HDL Ratio: 5.6 Ratio
Triglycerides: 298 mg/dL — ABNORMAL HIGH (ref <150)
VLDL: 60 mg/dL — ABNORMAL HIGH (ref 0–40)

## 2015-02-14 LAB — POCT URINALYSIS DIPSTICK
Bilirubin, UA: NEGATIVE
Blood, UA: NEGATIVE
Glucose, UA: NEGATIVE
Ketones, UA: NEGATIVE
Leukocytes, UA: NEGATIVE
Nitrite, UA: NEGATIVE
Protein, UA: NEGATIVE
Spec Grav, UA: 1.015
Urobilinogen, UA: 0.2
pH, UA: 6.5

## 2015-02-14 LAB — POCT UA - MICROSCOPIC ONLY
Bacteria, U Microscopic: NEGATIVE
Casts, Ur, LPF, POC: NEGATIVE
Crystals, Ur, HPF, POC: NEGATIVE
Mucus, UA: NEGATIVE
RBC, urine, microscopic: NEGATIVE
WBC, Ur, HPF, POC: NEGATIVE
Yeast, UA: NEGATIVE

## 2015-02-14 LAB — POCT CBC
Granulocyte percent: 65.3 %G (ref 37–80)
HCT, POC: 49.1 % (ref 43.5–53.7)
Hemoglobin: 16.3 g/dL (ref 14.1–18.1)
Lymph, poc: 1.9 (ref 0.6–3.4)
MCH, POC: 31.2 pg (ref 27–31.2)
MCHC: 33.1 g/dL (ref 31.8–35.4)
MCV: 94.2 fL (ref 80–97)
MID (cbc): 0.7 (ref 0–0.9)
MPV: 7.2 fL (ref 0–99.8)
POC Granulocyte: 5 (ref 2–6.9)
POC LYMPH PERCENT: 25.5 %L (ref 10–50)
POC MID %: 9.2 %M (ref 0–12)
Platelet Count, POC: 242 10*3/uL (ref 142–424)
RBC: 5.21 M/uL (ref 4.69–6.13)
RDW, POC: 12.5 %
WBC: 7.6 10*3/uL (ref 4.6–10.2)

## 2015-02-14 MED ORDER — OMEPRAZOLE 20 MG PO CPDR: 20.0000 mg | DELAYED_RELEASE_CAPSULE | Freq: Every day | ORAL | Status: DC

## 2015-02-14 MED ORDER — PREDNISONE 20 MG PO TABS: ORAL_TABLET | ORAL | Status: DC

## 2015-02-14 MED ORDER — AMOXICILLIN 875 MG PO TABS: 875.0000 mg | ORAL_TABLET | Freq: Two times a day (BID) | ORAL | Status: DC

## 2015-02-14 MED ORDER — LORAZEPAM 0.5 MG PO TABS: 0.5000 mg | ORAL_TABLET | Freq: Every evening | ORAL | Status: DC | PRN

## 2015-02-14 MED ORDER — HYDROCORTISONE 2.5 % RE CREA: 1.0000 "application " | TOPICAL_CREAM | Freq: Two times a day (BID) | RECTAL | Status: DC

## 2015-02-14 NOTE — Patient Instructions (Signed)
Otitis Media With Effusion Otitis media with effusion is the presence of fluid in the middle ear. This is a common problem in children, which often follows ear infections. It may be present for weeks or longer after the infection. Unlike an acute ear infection, otitis media with effusion refers only to fluid behind the ear drum and not infection. Children with repeated ear and sinus infections and allergy problems are the most likely to get otitis media with effusion. CAUSES  The most frequent cause of the fluid buildup is dysfunction of the eustachian tubes. These are the tubes that drain fluid in the ears to the back of the nose (nasopharynx). SYMPTOMS   The main symptom of this condition is hearing loss. As a result, you or your child may:  Listen to the TV at a loud volume.  Not respond to questions.  Ask "what" often when spoken to.  Mistake or confuse one sound or word for another.  There may be a sensation of fullness or pressure but usually not pain. DIAGNOSIS   Your health care provider will diagnose this condition by examining you or your child's ears.  Your health care provider may test the pressure in you or your child's ear with a tympanometer.  A hearing test may be conducted if the problem persists. TREATMENT   Treatment depends on the duration and the effects of the effusion.  Antibiotics, decongestants, nose drops, and cortisone-type drugs (tablets or nasal spray) may not be helpful.  Children with persistent ear effusions may have delayed language or behavioral problems. Children at risk for developmental delays in hearing, learning, and speech may require referral to a specialist earlier than children not at risk.  You or your child's health care provider may suggest a referral to an ear, nose, and throat surgeon for treatment. The following may help restore normal hearing:  Drainage of fluid.  Placement of ear tubes (tympanostomy tubes).  Removal of adenoids  (adenoidectomy). HOME CARE INSTRUCTIONS   Avoid secondhand smoke.  Infants who are breastfed are less likely to have this condition.  Avoid feeding infants while they are lying flat.  Avoid known environmental allergens.  Avoid people who are sick. SEEK MEDICAL CARE IF:   Hearing is not better in 3 months.  Hearing is worse.  Ear pain.  Drainage from the ear.  Dizziness. MAKE SURE YOU:   Understand these instructions.  Will watch your condition.  Will get help right away if you are not doing well or get worse. Document Released: 10/18/2004 Document Revised: 01/25/2014 Document Reviewed: 04/07/2013 ExitCare Patient Information 2015 [employer]. This information is not intended to replace advice given to you by your health care provider. Make sure you discuss any questions you have with your health care provider. Health Maintenance A healthy lifestyle and preventative care can promote health and wellness.  Maintain regular health, dental, and eye exams.  Eat a healthy diet. Foods like vegetables, fruits, whole grains, low-fat dairy products, and lean protein foods contain the nutrients you need and are low in calories. Decrease your intake of foods high in solid fats, added sugars, and salt. Get information about a proper diet from your health care provider, if necessary.  Regular physical exercise is one of the most important things you can do for your health. Most adults should get at least 150 minutes of moderate-intensity exercise (any activity that increases your heart rate and causes you to sweat) each week. In addition, most adults need muscle-strengthening exercises on   2 or more days a week.   Maintain a healthy weight. The body mass index (BMI) is a screening tool to identify possible weight problems. It provides an estimate of body fat based on height and weight. Your health care provider can find your BMI and can help you achieve or maintain a healthy weight.  For males 20 years and older:  A BMI below 18.5 is considered underweight.  A BMI of 18.5 to 24.9 is normal.  A BMI of 25 to 29.9 is considered overweight.  A BMI of 30 and above is considered obese.  Maintain normal blood lipids and cholesterol by exercising and minimizing your intake of saturated fat. Eat a balanced diet with plenty of fruits and vegetables. Blood tests for lipids and cholesterol should begin at age 20 and be repeated every 5 years. If your lipid or cholesterol levels are high, you are over age 50, or you are at high risk for heart disease, you may need your cholesterol levels checked more frequently.Ongoing high lipid and cholesterol levels should be treated with medicines if diet and exercise are not working.  If you smoke, find out from your health care provider how to quit. If you do not use tobacco, do not start.  Lung cancer screening is recommended for adults aged 55-80 years who are at high risk for developing lung cancer because of a history of smoking. A yearly low-dose CT scan of the lungs is recommended for people who have at least a 30-pack-year history of smoking and are current smokers or have quit within the past 15 years. A pack year of smoking is smoking an average of 1 pack of cigarettes a day for 1 year (for example, a 30-pack-year history of smoking could mean smoking 1 pack a day for 30 years or 2 packs a day for 15 years). Yearly screening should continue until the smoker has stopped smoking for at least 15 years. Yearly screening should be stopped for people who develop a health problem that would prevent them from having lung cancer treatment.  If you choose to drink alcohol, do not have more than 2 drinks per day. One drink is considered to be 12 oz (360 mL) of beer, 5 oz (150 mL) of wine, or 1.5 oz (45 mL) of liquor.  Avoid the use of street drugs. Do not share needles with anyone. Ask for help if you need support or instructions about stopping the use  of drugs.  High blood pressure causes heart disease and increases the risk of stroke. Blood pressure should be checked at least every 1-2 years. Ongoing high blood pressure should be treated with medicines if weight loss and exercise are not effective.  If you are 45-79 years old, ask your health care provider if you should take aspirin to prevent heart disease.  Diabetes screening involves taking a blood sample to check your fasting blood sugar level. This should be done once every 3 years after age 45 if you are at a normal weight and without risk factors for diabetes. Testing should be considered at a younger age or be carried out more frequently if you are overweight and have at least 1 risk factor for diabetes.  Colorectal cancer can be detected and often prevented. Most routine colorectal cancer screening begins at the age of 50 and continues through age 75. However, your health care provider may recommend screening at an earlier age if you have risk factors for colon cancer. On a yearly basis, your   health care provider may provide home test kits to check for hidden blood in the stool. A small camera at the end of a tube may be used to directly examine the colon (sigmoidoscopy or colonoscopy) to detect the earliest forms of colorectal cancer. Talk to your health care provider about this at age 50 when routine screening begins. A direct exam of the colon should be repeated every 5-10 years through age 75, unless early forms of precancerous polyps or small growths are found.  People who are at an increased risk for hepatitis B should be screened for this virus. You are considered at high risk for hepatitis B if:  You were born in a country where hepatitis B occurs often. Talk with your health care provider about which countries are considered high risk.  Your parents were born in a high-risk country and you have not received a shot to protect against hepatitis B (hepatitis B vaccine).  You have  HIV or AIDS.  You use needles to inject street drugs.  You live with, or have sex with, someone who has hepatitis B.  You are a man who has sex with other men (MSM).  You get hemodialysis treatment.  You take certain medicines for conditions like cancer, organ transplantation, and autoimmune conditions.  Hepatitis C blood testing is recommended for all people born from 1945 through 1965 and any individual with known risk factors for hepatitis C.  Healthy men should no longer receive prostate-specific antigen (PSA) blood tests as part of routine cancer screening. Talk to your health care provider about prostate cancer screening.  Testicular cancer screening is not recommended for adolescents or adult males who have no symptoms. Screening includes self-exam, a health care provider exam, and other screening tests. Consult with your health care provider about any symptoms you have or any concerns you have about testicular cancer.  Practice safe sex. Use condoms and avoid high-risk sexual practices to reduce the spread of sexually transmitted infections (STIs).  You should be screened for STIs, including gonorrhea and chlamydia if:  You are sexually active and are younger than 24 years.  You are older than 24 years, and your health care provider tells you that you are at risk for this type of infection.  Your sexual activity has changed since you were last screened, and you are at an increased risk for chlamydia or gonorrhea. Ask your health care provider if you are at risk.  If you are at risk of being infected with HIV, it is recommended that you take a prescription medicine daily to prevent HIV infection. This is called pre-exposure prophylaxis (PrEP). You are considered at risk if:  You are a man who has sex with other men (MSM).  You are a heterosexual man who is sexually active with multiple partners.  You take drugs by injection.  You are sexually active with a partner who has  HIV.  Talk with your health care provider about whether you are at high risk of being infected with HIV. If you choose to begin PrEP, you should first be tested for HIV. You should then be tested every 3 months for as long as you are taking PrEP.  Use sunscreen. Apply sunscreen liberally and repeatedly throughout the day. You should seek shade when your shadow is shorter than you. Protect yourself by wearing long sleeves, pants, a wide-brimmed hat, and sunglasses year round whenever you are outdoors.  Tell your health care provider of new moles or changes in moles,   especially if there is a change in shape or color. Also, tell your health care provider if a mole is larger than the size of a pencil eraser.  A one-time screening for abdominal aortic aneurysm (AAA) and surgical repair of large AAAs by ultrasound is recommended for men aged 65-75 years who are current or former smokers.  Stay current with your vaccines (immunizations). Document Released: 03/08/2008 Document Revised: 09/15/2013 Document Reviewed: 02/05/2011 ExitCare Patient Information 2015 [employer]. This information is not intended to replace advice given to you by your health care provider. Make sure you discuss any questions you have with your health care provider.  

## 2015-02-14 NOTE — Progress Notes (Signed)
Subjective:  This chart was scribed for Elvina SidleKurt Lauenstein, MD by Stann Oresung-Kai Tsai, Medical Scribe. This patient was seen in room 14 and the patient's care was started 11:34 AM.    Patient ID: [name], male    DOB: 02/21/1966, 49 y.o.   MRN: [id number]  HPI [name] is a 49 y.o. male who [facility].   He mentions having gradual onset rhinorrhea for a month. He mentions taken Nyquil and nasal spray but with worsening effect.   He also notes his left ear feeling like something is stuck in there. And his right ear feels like there's fluid.  He said he's had some intermittent fever and had some cough this past weekend.   He complains of hemorrhoids and has had them in the past.  He had surgery for hernia in February 2015.    He [place] at [employer] [place] He wears earplugs and does a lot of physical work. He doesn't exercise.  He has no children and lives by himself. He's not sexually active.    Review of Systems  Constitutional: Positive for fever.  HENT: Positive for ear pain and rhinorrhea.   Respiratory: Positive for cough.        Objective:   Physical Exam  HENT:  Left ear dull   Genitourinary:  3mm lension on his left corona of his penis  small hemorrhoid 6 oclock   Nursing note and vitals reviewed.  HEENT grossly normal except for the dullness in the left ear. He does have some mucopurulent discharge from both nasal passages. Teeth are in good shape Neck: Supple no adenopathy or thyromegaly Chest: Clear Heart: Regular, no murmur or gallop Abdomen: Soft nontender without HSM, no hernia Genitalia: Patient has an unusual erythematous/violaceous swelling on the left side of the corona Extremities: Full range of motion of both legs and arms, no edema, good distal pulses Results for orders placed or performed in visit on 02/14/15  POCT CBC  Result Value Ref Range   WBC 7.6 4.6 - 10.2 K/uL   Lymph, poc 1.9 0.6 - 3.4   POC LYMPH PERCENT  25.5 10 - 50 %L   MID (cbc) 0.7 0 - 0.9   POC MID % 9.2 0 - 12 %M   POC Granulocyte 5.0 2 - 6.9   Granulocyte percent 65.3 37 - 80 %G   RBC 5.21 4.69 - 6.13 M/uL   Hemoglobin 16.3 14.1 - 18.1 g/dL   HCT, POC 78.249.1 95.643.5 - 53.7 %   MCV 94.2 80 - 97 fL   MCH, POC 31.2 27 - 31.2 pg   MCHC 33.1 31.8 - 35.4 g/dL   RDW, POC 21.312.5 %   Platelet Count, POC 242 142 - 424 K/uL   MPV 7.2 0 - 99.8 fL  POCT UA - Microscopic Only  Result Value Ref Range   WBC, Ur, HPF, POC neg    RBC, urine, microscopic neg    Bacteria, U Microscopic neg    Mucus, UA neg    Epithelial cells, urine per micros 0-1    Crystals, Ur, HPF, POC neg    Casts, Ur, LPF, POC neg    Yeast, UA neg   POCT urinalysis dipstick  Result Value Ref Range   Color, UA yellow    Clarity, UA clear    Glucose, UA neg    Bilirubin, UA neg    Ketones, UA neg    Spec Grav, UA 1.015  Blood, UA neg    pH, UA 6.5    Protein, UA neg    Urobilinogen, UA 0.2    Nitrite, UA neg    Leukocytes, UA Negative            Assessment & Plan:   This chart was scribed in my presence and reviewed by me personally.    ICD-9-CM ICD-10-CM   1. Annual physical exam V70.0 Z00.00 POCT CBC     COMPLETE METABOLIC PANEL WITH GFR     Lipid panel     POCT UA - Microscopic Only     POCT urinalysis dipstick  2. Gastroesophageal reflux disease without esophagitis 530.81 K21.9 omeprazole (PRILOSEC) 20 MG capsule  3. Acute nonsuppurative otitis media of right ear 381.00 H65.191 amoxicillin (AMOXIL) 875 MG tablet     hydrocortisone (ANUSOL-HC) 2.5 % rectal cream  4. Acute maxillary sinusitis, recurrence not specified 461.0 J01.00 POCT CBC  5. Cough 786.2 R05 predniSONE (DELTASONE) 20 MG tablet     POCT CBC  6. Insomnia 780.52 G47.00 LORazepam (ATIVAN) 0.5 MG tablet  7. Penile lesion 607.89 N48.89 RPR     HIV antibody     GC/Chlamydia Probe Amp  8. Screening for hyperlipidemia V77.91 Z13.220 Lipid panel     Signed, Elvina Sidle, MD

## 2015-02-15 LAB — GC/CHLAMYDIA PROBE AMP
CT Probe RNA: NEGATIVE
GC Probe RNA: NEGATIVE

## 2015-02-15 LAB — RPR

## 2015-02-15 LAB — HIV ANTIBODY (ROUTINE TESTING W REFLEX): HIV 1&2 Ab, 4th Generation: NONREACTIVE

## 2015-02-16 LAB — HSV(HERPES SIMPLEX VRS) I + II AB-IGM: Herpes Simplex Vrs I&II-IgM Ab (EIA): 0.46 INDEX

## 2015-02-17 ENCOUNTER — Encounter: Payer: Self-pay | Admitting: Family Medicine

## 2015-12-27 ENCOUNTER — Ambulatory Visit (INDEPENDENT_AMBULATORY_CARE_PROVIDER_SITE_OTHER): Payer: BLUE CROSS/BLUE SHIELD | Admitting: Physician Assistant

## 2015-12-27 VITALS — BP 120/80 | HR 57 | Temp 97.7°F | Resp 16 | Ht 70.5 in | Wt 183.0 lb

## 2015-12-27 DIAGNOSIS — R059 Cough, unspecified: Secondary | ICD-10-CM

## 2015-12-27 DIAGNOSIS — M722 Plantar fascial fibromatosis: Secondary | ICD-10-CM

## 2015-12-27 DIAGNOSIS — J302 Other seasonal allergic rhinitis: Secondary | ICD-10-CM

## 2015-12-27 DIAGNOSIS — M79671 Pain in right foot: Secondary | ICD-10-CM | POA: Diagnosis not present

## 2015-12-27 DIAGNOSIS — R05 Cough: Secondary | ICD-10-CM

## 2015-12-27 LAB — GLUCOSE, POCT (MANUAL RESULT ENTRY): POC GLUCOSE: 90 mg/dL (ref 70–99)

## 2015-12-27 LAB — LIPID PANEL
CHOL/HDL RATIO: 5.3 ratio — AB (ref <=5.0)
Cholesterol: 206 mg/dL — ABNORMAL HIGH (ref 125–200)
HDL: 39 mg/dL — ABNORMAL LOW (ref >=40)
LDL Cholesterol: 107 mg/dL (ref <130)
Triglycerides: 298 mg/dL — ABNORMAL HIGH (ref <150)
VLDL: 60 mg/dL — ABNORMAL HIGH (ref <30)

## 2015-12-27 MED ORDER — PSEUDOEPHEDRINE HCL 60 MG PO TABS: 60.0000 mg | ORAL_TABLET | Freq: Four times a day (QID) | ORAL | Status: DC | PRN

## 2015-12-27 MED ORDER — FLUTICASONE PROPIONATE 50 MCG/ACT NA SUSP: 2.0000 | Freq: Every day | NASAL | Status: DC

## 2015-12-27 MED ORDER — CETIRIZINE HCL 10 MG PO TABS: 10.0000 mg | ORAL_TABLET | Freq: Every day | ORAL | Status: DC

## 2015-12-27 MED ORDER — MELOXICAM 15 MG PO TABS: 15.0000 mg | ORAL_TABLET | Freq: Every day | ORAL | Status: DC

## 2015-12-27 NOTE — Progress Notes (Signed)
[facility] [address], [place] [id number] [phone]  Date:  12/27/2015   Name:  Anthony GuarneriOscar Kortz   DOB:  03/23/1966   MRN:  [id number]  PCP:  Shade FloodGREENE,JEFFREY R, MD    History of Present Illness:  [name] is a 50 y.o. male patient who [facility] ear pain and heel pain.  Heel pain started 3 weeks ago.  No hx of trauma.  He walks around for work a lot.  Wears boots, and places insoles in the shoe.  He states it is his right heel.  At night it will feel like pins and needles.  No skin break down.  No redness, or warmth to the area.  He has tried advil and epsom salt which has helped minimally.  He has some pain that may radiate up his leg.    Ear pain for a few days of the left ear.  He has no outer ear pain.  Feels like it is on the inside.  He has not had a hx of colds but states that he has sinus pressure on and off.  He has a hx of allergies.  Pollen is a trigger.  He has not treated this at all.  He has no drainage from ear, facial pain, or colored mucus.  He uses his ear plugs at work.  His hearing is minimally changed--hearing test performed q 6mos at work.  He has not tried anything to remedy his symptoms. No fever.     Patient Active Problem List   Diagnosis Date Noted  . LEG EDEMA, RIGHT 11/28/2010    No past medical history on file.  Past Surgical History  Procedure Laterality Date  . Rih    . Appendectomy    . Nerve surgery    . Hernia repair      Social History  Substance Use Topics  . Smoking status: Never Smoker   . Smokeless tobacco: None  . Alcohol Use: Yes    Family History  Problem Relation Age of Onset  . Cancer Mother     No Known Allergies  Medication list has been reviewed and updated.  No current outpatient prescriptions on file prior to visit.   No current facility-administered medications on file prior to visit.    ROS ROS otherwise unremarkable unless listed above.   Physical Examination: BP  120/80 mmHg  Pulse 57  Temp(Src) 97.7 F (36.5 C) (Oral)  Resp 16  Ht 5' 10.5" (1.791 m)  Wt 183 lb (83.008 kg)  BMI 25.88 kg/m2  SpO2 98% Ideal Body Weight: Weight in (lb) to have BMI = 25: 176.4  Physical Exam  Constitutional: He is oriented to person, place, and time. He appears well-developed and well-nourished. No distress.  HENT:  Head: Normocephalic and atraumatic.  Right Ear: External ear and ear canal normal. Tympanic membrane is not erythematous and not bulging. A middle ear effusion is present.  Left Ear: External ear and ear canal normal. Tympanic membrane is not erythematous and not bulging. A middle ear effusion is present.  Nose: Mucosal edema and rhinorrhea present. Right sinus exhibits no maxillary sinus tenderness and no frontal sinus tenderness. Left sinus exhibits no maxillary sinus tenderness and no frontal sinus tenderness.  Mouth/Throat: No uvula swelling. No oropharyngeal exudate, posterior oropharyngeal edema or posterior oropharyngeal erythema.  Eyes: Conjunctivae, EOM and lids are normal. Pupils are equal, round, and reactive to light. Right eye exhibits normal extraocular motion. Left eye  exhibits normal extraocular motion.  Neck: Trachea normal and full passive range of motion without pain. No edema and no erythema present.  Cardiovascular: Normal rate.   Pulmonary/Chest: Effort normal. No respiratory distress. He has no decreased breath sounds. He has no wheezes. He has no rhonchi.  Musculoskeletal:       Right ankle: Achilles tendon normal.  Tender at the heel near heel spur.  Pain incited with direct heel pressure.  No erythema, open wounds or lesions at the base.  Normal capillary refill.   Neurological: He is alert and oriented to person, place, and time.  Skin: Skin is warm and dry. He is not diaphoretic.  Psychiatric: He has a normal mood and affect. His behavior is normal.     Assessment and Plan: Jaysiah Marchetta is a 50 y.o. male who is here  today for cc of ear pain, and right heel pain. Right heel pain appears to be plantar fasciitis.  i have advised heel cups, and icing.  Given mobic today.  Precautions given.  i have advised stretches.  He will rtc in 2-3 weeks if his sxs have not improved.  Seasonal allergies untxd likely causing his eustachian tube dysfunction.  Treating allergies and decongestant also   Heel pain, right - Plan: POCT glucose (manual entry), Lipid panel  Seasonal allergies - Plan: fluticasone (FLONASE) 50 MCG/ACT nasal spray, cetirizine (ZYRTEC) 10 MG tablet, pseudoephedrine (SUDAFED) 60 MG tablet  Cough  Plantar fascia syndrome - Plan: meloxicam (MOBIC) 15 MG tablet  Trena Platt, PA-C [facility] 4/4/20174:22 PM

## 2015-12-27 NOTE — Patient Instructions (Addendum)
IF you received an x-ray today, you will receive an invoice from [facility]. Please contact [facility] at [phone] with questions or concerns regarding your invoice.   IF you received labwork today, you will receive an invoice from [employer]. Please contact [facility] at [phone] with questions or concerns regarding your invoice.   Our billing staff will not be able to assist you with questions regarding bills from these companies.  You will be contacted with the lab results as soon as they are available. The fastest way to get your results is to activate your My Chart account. Instructions are located on the last page of this paperwork. If you have not heard from [place] regarding the results in 2 weeks, please contact this office.    Please ice the heel three times per day for 15 minutes.  This will be in an ice bath.   Please perform these stretches three times per day, in 6 reps.  If it says hold a stretch, you are to hold the position for 10 seconds.  Ice immediately afterward.  Keep the heel cup in the shoe. Take the mobic once per day.  Do not take ibuprofen or naproxen with this medication.  You are able to do tylenol. Let me know if there is no improvement within 2-3 weeks.  This is a process that is tedious.  Please wear heel cup.  I would like you to hydrate well with water 64 oz or more.   Take the zyrtec daily.  You have allergies.  This is a heavy pollen season.  Do the flonase until congestion has resolved.  And you take the sudafed no longer than 5-7 days, or until ear pain has resolved.  Plantar Fasciitis With Rehab The plantar fascia is a fibrous, ligament-like, soft-tissue structure that spans the bottom of the foot. Plantar fasciitis, also called heel spur syndrome, is a condition that causes pain in the foot due to inflammation of the tissue. SYMPTOMS   Pain and tenderness on the underneath side of the foot.  Pain  that worsens with standing or walking. CAUSES  Plantar fasciitis is caused by irritation and injury to the plantar fascia on the underneath side of the foot. Common mechanisms of injury include:  Direct trauma to bottom of the foot.  Damage to a small nerve that runs under the foot where the main fascia attaches to the heel bone.  Stress placed on the plantar fascia due to bone spurs. RISK INCREASES WITH:   Activities that place stress on the plantar fascia (running, jumping, pivoting, or cutting).  Poor strength and flexibility.  Improperly fitted shoes.  Tight calf muscles.  Flat feet.  Failure to warm-up properly before activity.  Obesity. PREVENTION  Warm up and stretch properly before activity.  Allow for adequate recovery between workouts.  Maintain physical fitness:  Strength, flexibility, and endurance.  Cardiovascular fitness.  Maintain a health body weight.  Avoid stress on the plantar fascia.  Wear properly fitted shoes, including arch supports for individuals who have flat feet. PROGNOSIS  If treated properly, then the symptoms of plantar fasciitis usually resolve without surgery. However, occasionally surgery is necessary. RELATED COMPLICATIONS   Recurrent symptoms that may result in a chronic condition.  Problems of the lower back that are caused by compensating for the injury, such as limping.  Pain or weakness of the foot during push-off following surgery.  Chronic inflammation, scarring, and partial or complete fascia tear, occurring  more often from repeated injections. TREATMENT  Treatment initially involves the use of ice and medication to help reduce pain and inflammation. The use of strengthening and stretching exercises may help reduce pain with activity, especially stretches of the Achilles tendon. These exercises may be performed at home or with a therapist. Your caregiver may recommend that you use heel cups of arch supports to help reduce  stress on the plantar fascia. Occasionally, corticosteroid injections are given to reduce inflammation. If symptoms persist for greater than 6 months despite non-surgical (conservative), then surgery may be recommended.  MEDICATION   If pain medication is necessary, then nonsteroidal anti-inflammatory medications, such as aspirin and ibuprofen, or other minor pain relievers, such as acetaminophen, are often recommended.  Do not take pain medication within 7 days before surgery.  Prescription pain relievers may be given if deemed necessary by your caregiver. Use only as directed and only as much as you need.  Corticosteroid injections may be given by your caregiver. These injections should be reserved for the most serious cases, because they may only be given a certain number of times. HEAT AND COLD  Cold treatment (icing) relieves pain and reduces inflammation. Cold treatment should be applied for 10 to 15 minutes every 2 to 3 hours for inflammation and pain and immediately after any activity that aggravates your symptoms. Use ice packs or massage the area with a piece of ice (ice massage).  Heat treatment may be used prior to performing the stretching and strengthening activities prescribed by your caregiver, physical therapist, or athletic trainer. Use a heat pack or soak the injury in warm water. SEEK IMMEDIATE MEDICAL CARE IF:  Treatment seems to offer no benefit, or the condition worsens.  Any medications produce adverse side effects. EXERCISES RANGE OF MOTION (ROM) AND STRETCHING EXERCISES - Plantar Fasciitis (Heel Spur Syndrome) These exercises may help you when beginning to rehabilitate your injury. Your symptoms may resolve with or without further involvement from your physician, physical therapist or athletic trainer. While completing these exercises, remember:   Restoring tissue flexibility helps normal motion to return to the joints. This allows healthier, less painful movement  and activity.  An effective stretch should be held for at least 30 seconds.  A stretch should never be painful. You should only feel a gentle lengthening or release in the stretched tissue. RANGE OF MOTION - Toe Extension, Flexion  Sit with your right / left leg crossed over your opposite knee.  Grasp your toes and gently pull them back toward the top of your foot. You should feel a stretch on the bottom of your toes and/or foot.  Hold this stretch for __________ seconds.  Now, gently pull your toes toward the bottom of your foot. You should feel a stretch on the top of your toes and or foot.  Hold this stretch for __________ seconds. Repeat __________ times. Complete this stretch __________ times per day.  RANGE OF MOTION - Ankle Dorsiflexion, Active Assisted  Remove shoes and sit on a chair that is preferably not on a carpeted surface.  Place right / left foot under knee. Extend your opposite leg for support.  Keeping your heel down, slide your right / left foot back toward the chair until you feel a stretch at your ankle or calf. If you do not feel a stretch, slide your bottom forward to the edge of the chair, while still keeping your heel down.  Hold this stretch for __________ seconds. Repeat __________ times. Complete this  stretch __________ times per day.  STRETCH - Gastroc, Standing  Place hands on wall.  Extend right / left leg, keeping the front knee somewhat bent.  Slightly point your toes inward on your back foot.  Keeping your right / left heel on the floor and your knee straight, shift your weight toward the wall, not allowing your back to arch.  You should feel a gentle stretch in the right / left calf. Hold this position for __________ seconds. Repeat __________ times. Complete this stretch __________ times per day. STRETCH - Soleus, Standing  Place hands on wall.  Extend right / left leg, keeping the other knee somewhat bent.  Slightly point your toes  inward on your back foot.  Keep your right / left heel on the floor, bend your back knee, and slightly shift your weight over the back leg so that you feel a gentle stretch deep in your back calf.  Hold this position for __________ seconds. Repeat __________ times. Complete this stretch __________ times per day. STRETCH - Gastrocsoleus, Standing  Note: This exercise can place a lot of stress on your foot and ankle. Please complete this exercise only if specifically instructed by your caregiver.   Place the ball of your right / left foot on a step, keeping your other foot firmly on the same step.  Hold on to the wall or a rail for balance.  Slowly lift your other foot, allowing your body weight to press your heel down over the edge of the step.  You should feel a stretch in your right / left calf.  Hold this position for __________ seconds.  Repeat this exercise with a slight bend in your right / left knee. Repeat __________ times. Complete this stretch __________ times per day.  STRENGTHENING EXERCISES - Plantar Fasciitis (Heel Spur Syndrome)  These exercises may help you when beginning to rehabilitate your injury. They may resolve your symptoms with or without further involvement from your physician, physical therapist or athletic trainer. While completing these exercises, remember:   Muscles can gain both the endurance and the strength needed for everyday activities through controlled exercises.  Complete these exercises as instructed by your physician, physical therapist or athletic trainer. Progress the resistance and repetitions only as guided. STRENGTH - Towel Curls  Sit in a chair positioned on a non-carpeted surface.  Place your foot on a towel, keeping your heel on the floor.  Pull the towel toward your heel by only curling your toes. Keep your heel on the floor.  If instructed by your physician, physical therapist or athletic trainer, add ____________________ at the end  of the towel. Repeat __________ times. Complete this exercise __________ times per day. STRENGTH - Ankle Inversion  Secure one end of a rubber exercise band/tubing to a fixed object (table, pole). Loop the other end around your foot just before your toes.  Place your fists between your knees. This will focus your strengthening at your ankle.  Slowly, pull your big toe up and in, making sure the band/tubing is positioned to resist the entire motion.  Hold this position for __________ seconds.  Have your muscles resist the band/tubing as it slowly pulls your foot back to the starting position. Repeat __________ times. Complete this exercises __________ times per day.    This information is not intended to replace advice given to you by your health care provider. Make sure you discuss any questions you have with your health care provider.   Document Released: 09/10/2005  Document Revised: 01/25/2015 Document Reviewed: 12/23/2008 Elsevier Interactive Patient Education 2016 [employer] Barotitis media is inflammation of your middle ear. This occurs when the auditory tube (eustachian tube) leading from the back of your nose (nasopharynx) to your eardrum is blocked. This blockage may result from a cold, environmental allergies, or an upper respiratory infection. Unresolved barotitis media may lead to damage or hearing loss (barotrauma), which may become permanent. HOME CARE INSTRUCTIONS   Use medicines as recommended by your health care provider. Over-the-counter medicines will help unblock the canal and can help during times of air travel.  Do not put anything into your ears to clean or unplug them. Eardrops will not be helpful.  Do not swim, dive, or fly until your health care provider says it is all right to do so. If these activities are necessary, chewing gum with frequent, forceful swallowing may help. It is also helpful to hold your nose and gently blow to pop your ears  for equalizing pressure changes. This forces air into the eustachian tube.  Only take over-the-counter or prescription medicines for pain, discomfort, or fever as directed by your health care provider.  A decongestant may be helpful in decongesting the middle ear and make pressure equalization easier. SEEK MEDICAL CARE IF:  You experience a serious form of dizziness in which you feel as if the room is spinning and you feel nauseated (vertigo).  Your symptoms only involve one ear. SEEK IMMEDIATE MEDICAL CARE IF:   You develop a severe headache, dizziness, or severe ear pain.  You have bloody or pus-like drainage from your ears.  You develop a fever.  Your problems do not improve or become worse. MAKE SURE YOU:   Understand these instructions.  Will watch your condition.  Will get help right away if you are not doing well or get worse.   This information is not intended to replace advice given to you by your health care provider. Make sure you discuss any questions you have with your health care provider.   Document Released: 09/07/2000 Document Revised: 07/01/2013 Document Reviewed: 04/07/2013 Elsevier Interactive Patient Education [employer] Elsevier Inc.

## 2016-09-07 ENCOUNTER — Ambulatory Visit (INDEPENDENT_AMBULATORY_CARE_PROVIDER_SITE_OTHER): Payer: BLUE CROSS/BLUE SHIELD | Admitting: Physician Assistant

## 2016-09-07 VITALS — BP 122/72 | HR 56 | Temp 98.1°F | Resp 17 | Ht 70.5 in | Wt 186.0 lb

## 2016-09-07 DIAGNOSIS — R21 Rash and other nonspecific skin eruption: Secondary | ICD-10-CM | POA: Diagnosis not present

## 2016-09-07 DIAGNOSIS — E7800 Pure hypercholesterolemia, unspecified: Secondary | ICD-10-CM | POA: Insufficient documentation

## 2016-09-07 MED ORDER — TRIAMCINOLONE ACETONIDE 0.5 % EX CREA
1.0000 | TOPICAL_CREAM | Freq: Two times a day (BID) | CUTANEOUS | 0 refills | Status: DC
Start: 2016-09-07 — End: 2017-03-11

## 2016-09-07 NOTE — Patient Instructions (Addendum)
  Use a good moisturizer - Eucerin or Vaseline Intensive Care to help with dry skin - pat your skin after shower and not rub  I will contact you with your lab results as soon as they are available.   If you have not heard from me in 2 weeks, please contact me.  The fastest way to get your results is to register for My Chart (see the instructions on the last page of this printout).    IF you received an x-ray today, you will receive an invoice from [facility]. Please contact [facility] at [phone] with questions or concerns regarding your invoice.   IF you received labwork today, you will receive an invoice from [facility]. Please contact [facility] at [phone] with questions or concerns regarding your invoice.   Our billing staff will not be able to assist you with questions regarding bills from these companies.  You will be contacted with the lab results as soon as they are available. The fastest way to get your results is to activate your My Chart account. Instructions are located on the last page of this paperwork. If you have not heard from us regarding the results in 2 weeks, please contact this office.

## 2016-09-07 NOTE — Progress Notes (Signed)
   [name]  MRN: [id number] DOB: 08/22/1966  Subjective:  Pt presents to clinic with rash under both arms.  He has been using cortisone cream and that helps.  No change in soaps, lotions or detergents.   Last lab work was April and it showed high cholesterol - he would like to have it checked today - he has not changed his diet but he did add fish oil capsules 2/day.    Review of Systems  Constitutional: Negative for chills and fever.  Skin: Positive for rash.    Patient Active Problem List   Diagnosis Date Noted  . Elevated cholesterol 09/07/2016  . LEG EDEMA, RIGHT 11/28/2010    No current outpatient prescriptions on file prior to visit.   No current facility-administered medications on file prior to visit.     No Known Allergies  Pt patients past, family and social history were reviewed and updated.   Objective:  BP 122/72 (BP Location: Right Arm, Patient Position: Sitting, Cuff Size: Normal)   Pulse (!) 56   Temp 98.1 F (36.7 C) (Oral)   Resp 17   Ht 5' 10.5" (1.791 m)   Wt 186 lb (84.4 kg)   SpO2 99%   BMI 26.31 kg/m   Physical Exam  Constitutional: He is oriented to person, place, and time and well-developed, well-nourished, and in no distress.  HENT:  Head: Normocephalic and atraumatic.  Right Ear: External ear normal.  Left Ear: External ear normal.  Eyes: Conjunctivae are normal.  Neck: Normal range of motion.  Cardiovascular: Normal rate, regular rhythm and normal heart sounds.   No murmur heard. Pulmonary/Chest: Effort normal and breath sounds normal. He has no wheezes.  Neurological: He is alert and oriented to person, place, and time. Gait normal.  Skin: Skin is warm and dry.  Erythematous dry skin on bilateral axilla - no rash in the area with hair just surrounding the axilla - some extending on his chest wall - no signs of infection  Psychiatric: Mood, memory, affect and judgment normal.    Assessment and Plan :  Rash and nonspecific  skin eruption - Plan: triamcinolone cream (KENALOG) 0.5 % - appears to be dry skin - d/w pt how to dry after bathing to prevent dry skin and good moisturizing  Elevated cholesterol - Plan: Lipid panel - check labs - we will determine treatment based on lab results  Benny LennertSarah Keante Urizar PA-C  [facility] 09/07/2016 10:36 AM

## 2016-09-08 LAB — LIPID PANEL
CHOL/HDL RATIO: 5.1 ratio — AB (ref 0.0–5.0)
Cholesterol, Total: 182 mg/dL (ref 100–199)
HDL: 36 mg/dL — ABNORMAL LOW (ref >39)
LDL Calculated: 113 mg/dL — ABNORMAL HIGH (ref 0–99)
Triglycerides: 167 mg/dL — ABNORMAL HIGH (ref 0–149)
VLDL Cholesterol Cal: 33 mg/dL (ref 5–40)

## 2016-09-19 ENCOUNTER — Encounter: Payer: Self-pay | Admitting: *Deleted

## 2016-12-10 ENCOUNTER — Ambulatory Visit (INDEPENDENT_AMBULATORY_CARE_PROVIDER_SITE_OTHER): Payer: BLUE CROSS/BLUE SHIELD | Admitting: Family Medicine

## 2016-12-10 ENCOUNTER — Encounter: Payer: Self-pay | Admitting: Family Medicine

## 2016-12-10 ENCOUNTER — Ambulatory Visit (INDEPENDENT_AMBULATORY_CARE_PROVIDER_SITE_OTHER): Payer: BLUE CROSS/BLUE SHIELD

## 2016-12-10 VITALS — BP 118/88 | HR 57 | Temp 97.8°F | Resp 16 | Ht 68.5 in | Wt 182.0 lb

## 2016-12-10 DIAGNOSIS — M25521 Pain in right elbow: Secondary | ICD-10-CM

## 2016-12-10 DIAGNOSIS — M7021 Olecranon bursitis, right elbow: Secondary | ICD-10-CM | POA: Diagnosis not present

## 2016-12-10 NOTE — Progress Notes (Signed)
By signing my name below, I, Mesha Guinyard, attest that this documentation has been prepared under the direction and in the presence of Meredith Staggers, MD.  Electronically Signed: Arvilla Market, Medical Scribe. 12/10/16. 8:26 AM.  Subjective:    Patient ID: [name], male    DOB: [birth year], 51 y.o.   MRN: [id number]  HPI Chief Complaint  Patient presents with  . right elbow    hurting,swelling x 2 wks    HPI Comments: [name] is a 51 y.o. male who presents to the [facility] complaining of intermittent painful right elbow edema onset 2 weeks ago. Reports associated sxs of pain inside his elbow, initial bruise inside elbow with unknown source, improving edema, and chills 3 weeks ago. Found relief of edema with ice, and took aleve BID for 4 days last week for relief of sxs (not taken yesterday or recently). Pt works at [employer] but doesn't remember injury. Denies fever, and injury to concerned area.        Patient Active Problem List   Diagnosis Date Noted  . Elevated cholesterol 09/07/2016  . LEG EDEMA, RIGHT 11/28/2010   No past medical history on file. Past Surgical History:  Procedure Laterality Date  . APPENDECTOMY    . HERNIA REPAIR    . NERVE SURGERY    . RIH     No Known Allergies Prior to Admission medications   Medication Sig Start Date End Date Taking? Authorizing Provider  triamcinolone cream (KENALOG) 0.5 % Apply 1 application topically 2 (two) times daily. 09/07/16   Morrell Riddle, PA-C   Social History   Social History  . Marital status: Single    Spouse name: N/A  . Number of children: N/A  . Years of education: N/A   Occupational History  . Not on file.   Social History Main Topics  . Smoking status: Never Smoker  . Smokeless tobacco: Never Used  . Alcohol use Yes  . Drug use: No  . Sexual activity: No   Other Topics Concern  . Not on file   Social History Narrative  . No narrative on file   Review of  Systems  Constitutional: Positive for chills. Negative for fever.  Musculoskeletal: Positive for arthralgias and joint swelling.  Hematological: Bruises/bleeds easily.   Objective:  Physical Exam  Constitutional: He appears well-developed and well-nourished. No distress.  HENT:  Head: Normocephalic and atraumatic.  Eyes: Conjunctivae are normal.  Neck: Neck supple.  Cardiovascular: Normal rate.   Pulmonary/Chest: Effort normal.  Musculoskeletal:  Tender along the medial epicondyle Very minimal soft tissue swelling at the olecranon prominence with slight discomfort along the olecranon Radial head and lateral epicondyle non tender At the wrist there, is no bony tenderness and FROM Elbow FROM Full strength at the elbow NVI distally without appreciable soft tissue swelling Hand veins are flat with arm hand elevation  Neurological: He is alert.  Skin: Skin is warm and dry.  Psychiatric: He has a normal mood and affect. His behavior is normal.  Nursing note and vitals reviewed.   Vitals:   12/10/16 0815  BP: 118/88  Pulse: (!) 57  Resp: 16  Temp: 97.8 F (36.6 C)  TempSrc: Oral  SpO2: 99%  Weight: 182 lb (82.6 kg)  Height: 5' 8.5" (1.74 m)   Body mass index is 27.27 kg/m.   Dg Elbow Complete Right (3+view)  Result Date: 12/10/2016 CLINICAL DATA:  Right elbow swelling for 2 weeks. EXAM: RIGHT ELBOW -  COMPLETE 3+ VIEW COMPARISON:  None. FINDINGS: There is no evidence of fracture, dislocation, or joint effusion. There is no evidence of arthropathy or other focal bone abnormality. Soft tissues are unremarkable. IMPRESSION: No acute osseous injury of the right elbow. Electronically Signed   By: Elige Ko   On: 12/10/2016 08:57   Assessment & Plan:   [name] is a 51 y.o. male Right elbow pain - Plan: DG ELBOW COMPLETE RIGHT (3+VIEW)  Olecranon bursitis of right elbow Suspected overuse with olecranon bursitis, and possible component of forearm tendinopathy based on  symptoms.   -improving, and does not need aspiration at this time.   -relative rest, otc ibuprofen and symptomatic care discussed. rtc precautions.   No orders of the defined types were placed in this encounter.  Patient Instructions   Elbow pain likely form overuse and then swelling of bursa.   Since improved, no new medication or injection needed at this time. Try to avoid any additional repetitive use other than what is required for work.  If not continuing to improve in next 2 weeks - recheck. Return to the clinic or go to the nearest emergency room if any of your symptoms worsen or new symptoms occur.   Elbow Bursitis Elbow bursitis is inflammation of the fluid-filled sac (bursa) between the tip of your elbow bone (olecranon) and your skin. Elbow bursitis may also be called olecranon bursitis. Normally, the olecranon bursa has only a small amount of fluid in it to cushion and protect your elbow bone. Elbow bursitis causes fluid to build up inside the bursa. Over time, this swelling and inflammation can cause pain when you bend or lean on your elbow. What are the causes? Elbow bursitis may be caused by:  Elbow injury (acute trauma).  Leaning on hard surfaces for long periods of time.  Infection from an injury that breaks the skin near your elbow.  A bone growth (spur) that forms at the tip of your elbow.  A medical condition that causes inflammation in your body, such as gout or rheumatoid arthritis. The cause may also be unknown. What are the signs or symptoms? The first sign of elbow bursitis is usually swelling over the tip of your elbow. This can grow to be the size of a golf ball. This may start suddenly or develop gradually. You may also have:  Pain when bending or leaning on your elbow.  Restricted movement of your elbow. If your bursitis is caused by an infection, symptoms may also include:  Redness, warmth, and tenderness of the elbow.  Drainage of pus from the  swollen area over your elbow, if the skin breaks open. How is this diagnosed? Your health care provider may be able to diagnose elbow bursitis based on your signs and symptoms, especially if you have recently been injured. Your health care provider will also do a physical exam. This may include:  X-rays to look for a bone spur or a bone fracture.  Draining fluid from the bursa to test it for infection.  Blood tests to rule out gout or rheumatoid arthritis. How is this treated? Treatment for elbow bursitis depends on the cause. Treatment may include:  Medicines. These may include:  Over-the-counter medicines to relieve pain and inflammation.  Antibiotic medicines to fight infection.  Injections of anti-inflammatory medicines (steroids).  Wrapping your elbow with a bandage.  Draining fluid from the bursa.  Wearing elbow pads. If your bursitis does not get better with treatment, surgery may be needed  to remove the bursa. Follow these instructions at home:  Take medicines only as directed by your health care provider.  If you were prescribed an antibiotic medicine, finish all of it even if you start to feel better.  If your bursitis is caused by an injury, rest your elbow and wear your bandage as directed by your health care provider. You may alsoapply ice to the injured area as directed by your health care provider:  Put ice in a plastic bag.  Place a towel between your skin and the bag.  Leave the ice on for 20 minutes, 2-3 times per day.  Avoid any activities that cause elbow pain.  Use elbow pads or elbow wraps to cushion your elbow. Contact a health care provider if:  You have a fever.  Your symptoms do not get better with treatment.  Your pain or swelling gets worse.  Your elbow pain or swelling goes away and then returns.  You have drainage of pus from the swollen area over your elbow. This information is not intended to replace advice given to you by your  health care provider. Make sure you discuss any questions you have with your health care provider. Document Released: 10/10/2006 Document Revised: 02/16/2016 Document Reviewed: 05/19/2014 Elsevier Interactive Patient Education  2017 [employer]    IF you received an x-ray today, you will receive an invoice from [facility]. Please contact [facility] at [phone] with questions or concerns regarding your invoice.   IF you received labwork today, you will receive an invoice from [facility]. Please contact [facility] at [phone] with questions or concerns regarding your invoice.   Our billing staff will not be able to assist you with questions regarding bills from these companies.  You will be contacted with the lab results as soon as they are available. The fastest way to get your results is to activate your My Chart account. Instructions are located on the last page of this paperwork. If you have not heard from us regarding the results in 2 weeks, please contact this office.      I personally performed the services described in this documentation, which was scribed in my presence. The recorded information has been reviewed and considered for accuracy and completeness, addended by me as needed, and agree with information above.  Signed,   Meredith StaggersJeffrey Larken Urias, MD [facility].  12/10/16 9:02 AM

## 2016-12-10 NOTE — Patient Instructions (Addendum)
Elbow pain likely form overuse and then swelling of bursa.   Since improved, no new medication or injection needed at this time. Try to avoid any additional repetitive use other than what is required for work.  If not continuing to improve in next 2 weeks - recheck. Return to the clinic or go to the nearest emergency room if any of your symptoms worsen or new symptoms occur.   Elbow Bursitis Elbow bursitis is inflammation of the fluid-filled sac (bursa) between the tip of your elbow bone (olecranon) and your skin. Elbow bursitis may also be called olecranon bursitis. Normally, the olecranon bursa has only a small amount of fluid in it to cushion and protect your elbow bone. Elbow bursitis causes fluid to build up inside the bursa. Over time, this swelling and inflammation can cause pain when you bend or lean on your elbow. What are the causes? Elbow bursitis may be caused by:  Elbow injury (acute trauma).  Leaning on hard surfaces for long periods of time.  Infection from an injury that breaks the skin near your elbow.  A bone growth (spur) that forms at the tip of your elbow.  A medical condition that causes inflammation in your body, such as gout or rheumatoid arthritis. The cause may also be unknown. What are the signs or symptoms? The first sign of elbow bursitis is usually swelling over the tip of your elbow. This can grow to be the size of a golf ball. This may start suddenly or develop gradually. You may also have:  Pain when bending or leaning on your elbow.  Restricted movement of your elbow. If your bursitis is caused by an infection, symptoms may also include:  Redness, warmth, and tenderness of the elbow.  Drainage of pus from the swollen area over your elbow, if the skin breaks open. How is this diagnosed? Your health care provider may be able to diagnose elbow bursitis based on your signs and symptoms, especially if you have recently been injured. Your health care  provider will also do a physical exam. This may include:  X-rays to look for a bone spur or a bone fracture.  Draining fluid from the bursa to test it for infection.  Blood tests to rule out gout or rheumatoid arthritis. How is this treated? Treatment for elbow bursitis depends on the cause. Treatment may include:  Medicines. These may include:  Over-the-counter medicines to relieve pain and inflammation.  Antibiotic medicines to fight infection.  Injections of anti-inflammatory medicines (steroids).  Wrapping your elbow with a bandage.  Draining fluid from the bursa.  Wearing elbow pads. If your bursitis does not get better with treatment, surgery may be needed to remove the bursa. Follow these instructions at home:  Take medicines only as directed by your health care provider.  If you were prescribed an antibiotic medicine, finish all of it even if you start to feel better.  If your bursitis is caused by an injury, rest your elbow and wear your bandage as directed by your health care provider. You may alsoapply ice to the injured area as directed by your health care provider:  Put ice in a plastic bag.  Place a towel between your skin and the bag.  Leave the ice on for 20 minutes, 2-3 times per day.  Avoid any activities that cause elbow pain.  Use elbow pads or elbow wraps to cushion your elbow. Contact a health care provider if:  You have a fever.  Your symptoms do  not get better with treatment.  Your pain or swelling gets worse.  Your elbow pain or swelling goes away and then returns.  You have drainage of pus from the swollen area over your elbow. This information is not intended to replace advice given to you by your health care provider. Make sure you discuss any questions you have with your health care provider. Document Released: 10/10/2006 Document Revised: 02/16/2016 Document Reviewed: 05/19/2014 Elsevier Interactive Patient Education  2017 [employer].    IF you received an x-ray today, you will receive an invoice from [facility]. Please contact [facility] at [phone] with questions or concerns regarding your invoice.   IF you received labwork today, you will receive an invoice from [facility]. Please contact [facility] at [phone] with questions or concerns regarding your invoice.   Our billing staff will not be able to assist you with questions regarding bills from these companies.  You will be contacted with the lab results as soon as they are available. The fastest way to get your results is to activate your My Chart account. Instructions are located on the last page of this paperwork. If you have not heard from [place] regarding the results in 2 weeks, please contact this office.

## 2017-03-11 ENCOUNTER — Ambulatory Visit (INDEPENDENT_AMBULATORY_CARE_PROVIDER_SITE_OTHER): Payer: BLUE CROSS/BLUE SHIELD | Admitting: Family Medicine

## 2017-03-11 ENCOUNTER — Encounter: Payer: Self-pay | Admitting: Family Medicine

## 2017-03-11 VITALS — BP 115/72 | HR 59 | Temp 98.0°F | Resp 18 | Ht 69.88 in | Wt 189.2 lb

## 2017-03-11 DIAGNOSIS — L282 Other prurigo: Secondary | ICD-10-CM

## 2017-03-11 DIAGNOSIS — L237 Allergic contact dermatitis due to plants, except food: Secondary | ICD-10-CM | POA: Diagnosis not present

## 2017-03-11 MED ORDER — PREDNISONE 20 MG PO TABS: ORAL_TABLET | ORAL | 0 refills | Status: DC

## 2017-03-11 NOTE — Progress Notes (Addendum)
Subjective:  By signing my name below, I, Essence Howell, attest that this documentation has been prepared under the direction and in the presence of Shade Flood, MD Electronically Signed: Charline Bills, ED Scribe 03/11/2017 at 10:20 AM.   Patient ID: [name], male    DOB: [birth year], 51 y.o.   MRN: [id number]  Chief Complaint  Patient presents with  . Rash    all over back and arms x 2 weeks that is red and itchy   HPI [name] is a 51 y.o. male who presents to Primary Care at [facility] complaining of a generalized pruritic, non-tender, red rash first noticed 2 weeks ago. 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 He states that rash started as tiny red bumps/blisters on his neck and legs that has spread to his scalp, arms, abdomen, back and 1 place on his cheek. He reports 1 episode of emesis last week but none since. He has tried applying alcohol, Cortisone cream and taken Benadryl tablets without relief. Pt denies rash to his genitals or mouth, sob, difficulty breathing, fever, chills, abdominal pain, vomiting, nausea.  Patient Active Problem List   Diagnosis Date Noted  . Elevated cholesterol 09/07/2016  . LEG EDEMA, RIGHT 11/28/2010   History reviewed. No pertinent past medical history. Past Surgical History:  Procedure Laterality Date  . APPENDECTOMY    . HERNIA REPAIR    . NERVE SURGERY    . RIH     No Known Allergies Prior to Admission medications   Medication Sig Start Date End Date Taking? Authorizing Provider  triamcinolone cream (KENALOG) 0.5 % Apply 1 application topically 2 (two) times daily. Patient not taking: Reported on 12/10/2016 09/07/16   Morrell Riddle, PA-C   Social History   Social History  . Marital status: Single    Spouse name: N/A  . Number of children: N/A  . Years of education: N/A   Occupational History  . Not on file.   Social History Main Topics  . Smoking status: Never Smoker  .  Smokeless tobacco: Never Used  . Alcohol use Yes  . Drug use: No  . Sexual activity: No   Other Topics Concern  . Not on file   Social History Narrative  . No narrative on file   Review of Systems  Constitutional: Negative for chills and fever.  HENT: Negative for mouth sores.   Respiratory: Negative for shortness of breath.   Gastrointestinal: Negative for abdominal pain and nausea.  Skin: Positive for rash.      Objective:   Physical Exam  Constitutional: He is oriented to person, place, and time. He appears well-developed and well-nourished. No distress.  HENT:  Head: Normocephalic and atraumatic.  Mouth/Throat: No oral lesions.  No intraoral lesions. No vesicles or rash on face. Scalp: multiple erythematous patches Parietal scalp: erythematous papules with patches of excoriations  Eyes: Conjunctivae and EOM are normal.  Neck: Neck supple. No tracheal deviation present.  Cardiovascular: Normal rate, regular rhythm and normal heart sounds.   Pulmonary/Chest: Effort normal and breath sounds normal. No respiratory distress.  Musculoskeletal: Normal range of motion.  Neurological: He is alert and oriented to person, place, and time.  Skin: Skin is warm and dry.  Abdomen: areas of excoriations with areas of patches Arms: multiples vesicular lesions, some crusted, others with new appearing vesicles. Few lesions on dorsum of fingers but not interdigital  Legs: Crusted vesicular lesion on LE bilaterally  Psychiatric: He has a normal mood and affect. His behavior is normal.  Nursing note and vitals reviewed.  Vitals:   03/11/17 0958  BP: 115/72  Pulse: (!) 59  Resp: 18  Temp: 98 F (36.7 C)  TempSrc: Oral  SpO2: 98%  Weight: 189 lb 3.2 oz (85.8 kg)  Height: 5' 9.88" (1.775 m)      Assessment & Plan:    [name] is a 51 y.o. male Contact dermatitis due to poison ivy - Plan: predniSONE (DELTASONE) 20 MG tablet  Pruritic rash - Plan: predniSONE (DELTASONE) 20  MG tablet  Based on history and appearance at present, appears to be poison ivy now with diffuse spreading. Few erythematous areas around vesicular patches, suspected excoriation. Does not appear to be cellulitis at this time.  - Start prednisone taper. Okay to continue topical steroid to affected areas temporarily, Benadryl or Zyrtec as needed. (Called patient to clarify the patient instructions typo with Zyrtec, and potential risks and side effects of prednisone discussed).   RTC precautions and cautions for erythematous areas and possible cellulitis were discussed.  He had an additional question regarding colonoscopy. Advised it should be done anytime after age 56. Plans on returning in the next 1-2 months for a physical and can discuss different cancer screening at that time. Meds ordered this encounter  Medications  . predniSONE (DELTASONE) 20 MG tablet    Sig: 3 by mouth for 3 days, then 2 by mouth for 2 days, then 1 by mouth for 2 days, then 1/2 by mouth for 2 days.    Dispense:  16 tablet    Refill:  0   Patient Instructions    The rash appears to be due to a contact dermatitis, likely poison ivy that is not spreading. Start prednisone, okay to continue cortisone cream to the itchy areas for the next few days. Benadryl or severe tech over-the-counter if needed for itching. The areas of redness around your rash appeared to be due to to scratching. However if any spread of redness or that does not improve with avoiding scratching, then a skin infection is also possible, so return right away.  Return to the clinic or go to the nearest emergency room if any of your symptoms worsen or new symptoms occur.   Poison Ivy Dermatitis Poison ivy dermatitis is inflammation of the skin that is caused by the allergens on the leaves of the poison ivy plant. The skin reaction often involves redness, swelling, blisters, and extreme itching. What are the causes? This condition is caused by a specific  chemical (urushiol) found in the sap of the poison ivy plant. This chemical is sticky and can be easily spread to people, animals, and objects. You can get poison ivy dermatitis by:  Having direct contact with a poison ivy plant.  Touching animals, other people, or objects that have come in contact with poison ivy and have the chemical on them.  What increases the risk? This condition is more likely to develop in:  People who are outdoors often.  People who go outdoors without wearing protective clothing, such as closed shoes, long pants, and a long-sleeved shirt.  What are the signs or symptoms? Symptoms of this condition include:  Redness and itching.  A rash that often includes bumps and blisters. The rash usually appears 48 hours after exposure.  Swelling. This may occur if the reaction is more severe.  Symptoms usually last for 1-2 weeks. However, the first time you develop this  condition, symptoms may last 3-4 weeks. How is this diagnosed? This condition may be diagnosed based on your symptoms and a physical exam. Your health care provider may also ask you about any recent outdoor activity. How is this treated? Treatment for this condition will vary depending on how severe it is. Treatment may include:  Hydrocortisone creams or calamine lotions to relieve itching.  Oatmeal baths to soothe the skin.  Over-the-counter antihistamine tablets.  Oral steroid medicine for more severe outbreaks.  Follow these instructions at home:  Take or apply over-the-counter and prescription medicines only as told by your health care provider.  Wash exposed skin as soon as possible with soap and cold water.  Use hydrocortisone creams or calamine lotion as needed to soothe the skin and relieve itching.  Take oatmeal baths as needed. Use colloidal oatmeal. You can get this at your local pharmacy or grocery store. Follow the instructions on the packaging.  Do not scratch or rub your  skin.  While you have the rash, wash clothes right after you wear them. How is this prevented?  Learn to identify the poison ivy plant and avoid contact with the plant. This plant can be recognized by the number of leaves. Generally, poison ivy has three leaves with flowering branches on a single stem. The leaves are typically glossy, and they have jagged edges that come to a point at the front.  If you have been exposed to poison ivy, thoroughly wash with soap and water right away. You have about 30 minutes to remove the plant resin before it will cause the rash. Be sure to wash under your fingernails because any plant resin there will continue to spread the rash.  When hiking or camping, wear clothes that will help you to avoid exposure on the skin. This includes long pants, a long-sleeved shirt, tall socks, and hiking boots. You can also apply preventive lotion to your skin to help limit exposure.  If you suspect that your clothes or outdoor gear came in contact with poison ivy, rinse them off outside with a garden hose before you bring them inside your house. Contact a health care provider if:  You have open sores in the rash area.  You have more redness, swelling, or pain in the affected area.  You have redness that spreads beyond the rash area.  You have fluid, blood, or pus coming from the affected area.  You have a fever.  You have a rash over a large area of your body.  You have a rash on your eyes, mouth, or genitals.  Your rash does not improve after a few days. Get help right away if:  Your face swells or your eyes swell shut.  You have trouble breathing.  You have trouble swallowing. This information is not intended to replace advice given to you by your health care provider. Make sure you discuss any questions you have with your health care provider. Document Released: 09/07/2000 Document Revised: 02/16/2016 Document Reviewed: 02/16/2015 Elsevier Interactive  Patient Education  2018 [employer].    IF you received an x-ray today, you will receive an invoice from [facility]. Please contact [facility] at [phone] with questions or concerns regarding your invoice.   IF you received labwork today, you will receive an invoice from [place]. Please contact [facility] at [phone] with questions or concerns regarding your invoice.   Our billing staff will not be able to assist you with questions regarding bills from these companies.  You  will be contacted with the lab results as soon as they are available. The fastest way to get your results is to activate your My Chart account. Instructions are located on the last page of this paperwork. If you have not heard from us regarding the results in 2 weeks, please contact this office.       I personally performed the services described in this documentation, which was scribed in my presence. The recorded information has been reviewed and considered for accuracy and completeness, addended by me as needed, and agree with information above.  Signed,   Meredith StaggersJeffrey Teresita Fanton, MD Primary Care at [facility].  03/11/17 11:05 AM

## 2017-03-11 NOTE — Patient Instructions (Addendum)
The rash appears to be due to a contact dermatitis, likely poison ivy that is not spreading. Start prednisone, okay to continue cortisone cream to the itchy areas for the next few days. Benadryl or severe tech over-the-counter if needed for itching. The areas of redness around your rash appeared to be due to to scratching. However if any spread of redness or that does not improve with avoiding scratching, then a skin infection is also possible, so return right away.  Return to the clinic or go to the nearest emergency room if any of your symptoms worsen or new symptoms occur.   Poison Ivy Dermatitis Poison ivy dermatitis is inflammation of the skin that is caused by the allergens on the leaves of the poison ivy plant. The skin reaction often involves redness, swelling, blisters, and extreme itching. What are the causes? This condition is caused by a specific chemical (urushiol) found in the sap of the poison ivy plant. This chemical is sticky and can be easily spread to people, animals, and objects. You can get poison ivy dermatitis by:  Having direct contact with a poison ivy plant.  Touching animals, other people, or objects that have come in contact with poison ivy and have the chemical on them.  What increases the risk? This condition is more likely to develop in:  People who are outdoors often.  People who go outdoors without wearing protective clothing, such as closed shoes, long pants, and a long-sleeved shirt.  What are the signs or symptoms? Symptoms of this condition include:  Redness and itching.  A rash that often includes bumps and blisters. The rash usually appears 48 hours after exposure.  Swelling. This may occur if the reaction is more severe.  Symptoms usually last for 1-2 weeks. However, the first time you develop this condition, symptoms may last 3-4 weeks. How is this diagnosed? This condition may be diagnosed based on your symptoms and a physical exam. Your  health care provider may also ask you about any recent outdoor activity. How is this treated? Treatment for this condition will vary depending on how severe it is. Treatment may include:  Hydrocortisone creams or calamine lotions to relieve itching.  Oatmeal baths to soothe the skin.  Over-the-counter antihistamine tablets.  Oral steroid medicine for more severe outbreaks.  Follow these instructions at home:  Take or apply over-the-counter and prescription medicines only as told by your health care provider.  Wash exposed skin as soon as possible with soap and cold water.  Use hydrocortisone creams or calamine lotion as needed to soothe the skin and relieve itching.  Take oatmeal baths as needed. Use colloidal oatmeal. You can get this at your local pharmacy or grocery store. Follow the instructions on the packaging.  Do not scratch or rub your skin.  While you have the rash, wash clothes right after you wear them. How is this prevented?  Learn to identify the poison ivy plant and avoid contact with the plant. This plant can be recognized by the number of leaves. Generally, poison ivy has three leaves with flowering branches on a single stem. The leaves are typically glossy, and they have jagged edges that come to a point at the front.  If you have been exposed to poison ivy, thoroughly wash with soap and water right away. You have about 30 minutes to remove the plant resin before it will cause the rash. Be sure to wash under your fingernails because any plant resin there will continue to spread  the rash.  When hiking or camping, wear clothes that will help you to avoid exposure on the skin. This includes long pants, a long-sleeved shirt, tall socks, and hiking boots. You can also apply preventive lotion to your skin to help limit exposure.  If you suspect that your clothes or outdoor gear came in contact with poison ivy, rinse them off outside with a garden hose before you bring  them inside your house. Contact a health care provider if:  You have open sores in the rash area.  You have more redness, swelling, or pain in the affected area.  You have redness that spreads beyond the rash area.  You have fluid, blood, or pus coming from the affected area.  You have a fever.  You have a rash over a large area of your body.  You have a rash on your eyes, mouth, or genitals.  Your rash does not improve after a few days. Get help right away if:  Your face swells or your eyes swell shut.  You have trouble breathing.  You have trouble swallowing. This information is not intended to replace advice given to you by your health care provider. Make sure you discuss any questions you have with your health care provider. Document Released: 09/07/2000 Document Revised: 02/16/2016 Document Reviewed: 02/16/2015 Elsevier Interactive Patient Education  2018 [employer].    IF you received an x-ray today, you will receive an invoice from [facility]. Please contact [facility] at [phone] with questions or concerns regarding your invoice.   IF you received labwork today, you will receive an invoice from [place]. Please contact [facility] at [phone] with questions or concerns regarding your invoice.   Our billing staff will not be able to assist you with questions regarding bills from these companies.  You will be contacted with the lab results as soon as they are available. The fastest way to get your results is to activate your My Chart account. Instructions are located on the last page of this paperwork. If you have not heard from [place] regarding the results in 2 weeks, please contact this office.

## 2017-03-21 ENCOUNTER — Other Ambulatory Visit: Payer: Self-pay | Admitting: Family Medicine

## 2017-03-21 DIAGNOSIS — L237 Allergic contact dermatitis due to plants, except food: Secondary | ICD-10-CM

## 2017-03-21 DIAGNOSIS — L282 Other prurigo: Secondary | ICD-10-CM

## 2017-03-23 ENCOUNTER — Ambulatory Visit (INDEPENDENT_AMBULATORY_CARE_PROVIDER_SITE_OTHER): Payer: BLUE CROSS/BLUE SHIELD | Admitting: Family Medicine

## 2017-03-23 ENCOUNTER — Encounter: Payer: Self-pay | Admitting: Family Medicine

## 2017-03-23 DIAGNOSIS — L237 Allergic contact dermatitis due to plants, except food: Secondary | ICD-10-CM | POA: Diagnosis not present

## 2017-03-23 DIAGNOSIS — L282 Other prurigo: Secondary | ICD-10-CM

## 2017-03-23 MED ORDER — PREDNISONE 20 MG PO TABS: ORAL_TABLET | ORAL | 0 refills | Status: DC

## 2017-03-23 NOTE — Progress Notes (Signed)
By signing my name below, I, [name], attest that this documentation has been prepared under the direction and in the presence of Anthony StaggersJeffrey Dajanique Robley, MD.  Electronically Signed: Arvilla MarketMesha Cook, Medical Scribe. 03/23/17. 4:12 PM.  Subjective:    Patient ID: [name], male    DOB: 09/25/1965, 51 y.o.   MRN: [id number]  HPI Chief Complaint  Patient presents with  . Follow-up    finished Steroids and rash came back     HPI Comments: Anthony GuarneriOscar Cook is a 51 y.o. male who presents to Primary Care at [facility] for rash follow-up. He was seen June 18th for suspected contact dermatitis from poison ivy; treated with 9 days of prednisone.  His last dose of prednisone was Monday (5 days ago). His rash was improving with the prednisone but, 3 days after stopping it, rash came back. Pt found difficulty sleeping while on prednisone. He states the rash is itchy and locates his rash on his extremities, back of the neck, and face. Denies seeing things in his sheets, taking new medications, fever, feeling bad, and genital rash.   Patient Active Problem List   Diagnosis Date Noted  . Elevated cholesterol 09/07/2016  . LEG EDEMA, RIGHT 11/28/2010   History reviewed. No pertinent past medical history. Past Surgical History:  Procedure Laterality Date  . APPENDECTOMY    . HERNIA REPAIR    . NERVE SURGERY    . RIH     No Known Allergies Prior to Admission medications   Medication Sig Start Date End Date Taking? Authorizing Provider  predniSONE (DELTASONE) 20 MG tablet 3 by mouth for 3 days, then 2 by mouth for 2 days, then 1 by mouth for 2 days, then 1/2 by mouth for 2 days. Patient not taking: Reported on 03/23/2017 03/11/17   Shade FloodGreene, Acheron Sugg R, MD   Social History   Social History  . Marital status: Single    Spouse name: N/A  . Number of children: N/A  . Years of education: N/A   Occupational History  . Not on file.   Social History Main Topics  . Smoking status: Never Smoker  .  Smokeless tobacco: Never Used  . Alcohol use Yes  . Drug use: No  . Sexual activity: No   Other Topics Concern  . Not on file   Social History Narrative  . No narrative on file   Review of Systems  Constitutional: Negative for fever.  Genitourinary: Negative for genital sores.  Skin: Positive for color change and rash.  Psychiatric/Behavioral: Positive for sleep disturbance.   Objective:  Physical Exam  Constitutional: He appears well-developed and well-nourished. No distress.  HENT:  Head: Normocephalic and atraumatic.  Mouth/Throat: No oral lesions.  No intraoral lesions  Eyes: Conjunctivae are normal.  Neck: Neck supple.  Cardiovascular: Normal rate.   Pulmonary/Chest: Effort normal.  Neurological: He is alert.  Skin: Skin is warm and dry.  Multpile scattered erythematous patches with central vesicular areas that are on various dermatomes on the arm as well as on the ulnar and radial aspect on his hand at the wrist and thumb There are similar erythematous patches with vesicles on right upper arm Similar appearing lesions on the back of his neck L>R, posterior scalp, in front of the left ear Small papula on the left malar prominence No lesions around the eyes No lesions on the right leg Few small lesions on the left lower leg, left great, and second toe Other areas appeared to be dried and  healing  Psychiatric: He has a normal mood and affect. His behavior is normal.  Nursing note and vitals reviewed.   Vitals:   03/23/17 1508  BP: 127/85  Pulse: 70  Resp: 16  Temp: 98.2 F (36.8 C)  TempSrc: Oral  SpO2: 96%  Weight: 190 lb 6.4 oz (86.4 kg)  Height: 5' 9.88" (1.775 m)   Body mass index is 27.41 kg/m. Assessment & Plan:   [name] is a 51 y.o. male Contact dermatitis due to poison ivy - Plan: predniSONE (DELTASONE) 20 MG tablet  Pruritic rash - Plan: predniSONE (DELTASONE) 20 MG tablet  Improvement initially with prednisone taper and initial exam  suspicious for poison ivy. With recurrence after prednisone taper, likely poison ivy requiring more prolonged prednisone treatment. Although there are a few clusters of erythema, less likely bedbugs.  -Restart prednisone taper at 60 mg daily for 3 days, then taper to 10 mg over 9 days. If any increased lesions towards the end of the taper or recurrence after that taper, may need to extend to a longer course. If any persistent or worsening symptoms during that prednisone taper, can return for recheck.   - Symptomatic care with Benadryl or Zyrtec as needed, topical cortisone cream tonight and tomorrow if needed until prednisone starts to help with itching.  Meds ordered this encounter  Medications  . predniSONE (DELTASONE) 20 MG tablet    Sig: 3 by mouth for 3 days, then 2 by mouth for 2 days, then 1 by mouth for 2 days, then 1/2 by mouth for 2 days.    Dispense:  16 tablet    Refill:  0   Patient Instructions   Because your rash improved last time with prednisone, it does appear to be poison ivy again. Bedbugs are less likely, but those bites are usually treated with Benadryl or Zyrtec as needed for itching. It is not uncommon for poison ivy to come back after stopping prednisone as it may not have been used long enough.   Start prednisone at same dose as before, and this course should be sufficient for treatment. If you have any return of rash after this course of prednisone, or worsening,please return to discuss rash further.   Poison Ivy Dermatitis Poison ivy dermatitis is inflammation of the skin that is caused by the allergens on the leaves of the poison ivy plant. The skin reaction often involves redness, swelling, blisters, and extreme itching. What are the causes? This condition is caused by a specific chemical (urushiol) found in the sap of the poison ivy plant. This chemical is sticky and can be easily spread to people, animals, and objects. You can get poison ivy dermatitis  by:  Having direct contact with a poison ivy plant.  Touching animals, other people, or objects that have come in contact with poison ivy and have the chemical on them.  What increases the risk? This condition is more likely to develop in:  People who are outdoors often.  People who go outdoors without wearing protective clothing, such as closed shoes, long pants, and a long-sleeved shirt.  What are the signs or symptoms? Symptoms of this condition include:  Redness and itching.  A rash that often includes bumps and blisters. The rash usually appears 48 hours after exposure.  Swelling. This may occur if the reaction is more severe.  Symptoms usually last for 1-2 weeks. However, the first time you develop this condition, symptoms may last 3-4 weeks. How is this diagnosed? This  condition may be diagnosed based on your symptoms and a physical exam. Your health care provider may also ask you about any recent outdoor activity. How is this treated? Treatment for this condition will vary depending on how severe it is. Treatment may include:  Hydrocortisone creams or calamine lotions to relieve itching.  Oatmeal baths to soothe the skin.  Over-the-counter antihistamine tablets.  Oral steroid medicine for more severe outbreaks.  Follow these instructions at home:  Take or apply over-the-counter and prescription medicines only as told by your health care provider.  Wash exposed skin as soon as possible with soap and cold water.  Use hydrocortisone creams or calamine lotion as needed to soothe the skin and relieve itching.  Take oatmeal baths as needed. Use colloidal oatmeal. You can get this at your local pharmacy or grocery store. Follow the instructions on the packaging.  Do not scratch or rub your skin.  While you have the rash, wash clothes right after you wear them. How is this prevented?  Learn to identify the poison ivy plant and avoid contact with the plant. This  plant can be recognized by the number of leaves. Generally, poison ivy has three leaves with flowering branches on a single stem. The leaves are typically glossy, and they have jagged edges that come to a point at the front.  If you have been exposed to poison ivy, thoroughly wash with soap and water right away. You have about 30 minutes to remove the plant resin before it will cause the rash. Be sure to wash under your fingernails because any plant resin there will continue to spread the rash.  When hiking or camping, wear clothes that will help you to avoid exposure on the skin. This includes long pants, a long-sleeved shirt, tall socks, and hiking boots. You can also apply preventive lotion to your skin to help limit exposure.  If you suspect that your clothes or outdoor gear came in contact with poison ivy, rinse them off outside with a garden hose before you bring them inside your house. Contact a health care provider if:  You have open sores in the rash area.  You have more redness, swelling, or pain in the affected area.  You have redness that spreads beyond the rash area.  You have fluid, blood, or pus coming from the affected area.  You have a fever.  You have a rash over a large area of your body.  You have a rash on your eyes, mouth, or genitals.  Your rash does not improve after a few days. Get help right away if:  Your face swells or your eyes swell shut.  You have trouble breathing.  You have trouble swallowing. This information is not intended to replace advice given to you by your health care provider. Make sure you discuss any questions you have with your health care provider. Document Released: 09/07/2000 Document Revised: 02/16/2016 Document Reviewed: 02/16/2015 Elsevier Interactive Patient Education  2018 [employer].   IF you received an x-ray today, you will receive an invoice from [facility]. Please contact [facility] at [phone]  with questions or concerns regarding your invoice.   IF you received labwork today, you will receive an invoice from [place]. Please contact [facility] at [phone] with questions or concerns regarding your invoice.   Our billing staff will not be able to assist you with questions regarding bills from these companies.  You will be contacted with the lab results as soon as they are  available. The fastest way to get your results is to activate your My Chart account. Instructions are located on the last page of this paperwork. If you have not heard from [place] regarding the results in 2 weeks, please contact this office.       I personally performed the services described in this documentation, which was scribed in my presence. The recorded information has been reviewed and considered for accuracy and completeness, addended by me as needed, and agree with information above.  Signed,   Anthony Staggers, MD Primary Care at [facility].  03/23/17 4:35 PM

## 2017-03-23 NOTE — Patient Instructions (Addendum)
Because your rash improved last time with prednisone, it does appear to be poison ivy again. Bedbugs are less likely, but those bites are usually treated with Benadryl or Zyrtec as needed for itching. It is not uncommon for poison ivy to come back after stopping prednisone as it may not have been used long enough.   Start prednisone at same dose as before, and this course should be sufficient for treatment. If you have any return of rash after this course of prednisone, or worsening,please return to discuss rash further.   Poison Ivy Dermatitis Poison ivy dermatitis is inflammation of the skin that is caused by the allergens on the leaves of the poison ivy plant. The skin reaction often involves redness, swelling, blisters, and extreme itching. What are the causes? This condition is caused by a specific chemical (urushiol) found in the sap of the poison ivy plant. This chemical is sticky and can be easily spread to people, animals, and objects. You can get poison ivy dermatitis by:  Having direct contact with a poison ivy plant.  Touching animals, other people, or objects that have come in contact with poison ivy and have the chemical on them.  What increases the risk? This condition is more likely to develop in:  People who are outdoors often.  People who go outdoors without wearing protective clothing, such as closed shoes, long pants, and a long-sleeved shirt.  What are the signs or symptoms? Symptoms of this condition include:  Redness and itching.  A rash that often includes bumps and blisters. The rash usually appears 48 hours after exposure.  Swelling. This may occur if the reaction is more severe.  Symptoms usually last for 1-2 weeks. However, the first time you develop this condition, symptoms may last 3-4 weeks. How is this diagnosed? This condition may be diagnosed based on your symptoms and a physical exam. Your health care provider may also ask you about any recent outdoor  activity. How is this treated? Treatment for this condition will vary depending on how severe it is. Treatment may include:  Hydrocortisone creams or calamine lotions to relieve itching.  Oatmeal baths to soothe the skin.  Over-the-counter antihistamine tablets.  Oral steroid medicine for more severe outbreaks.  Follow these instructions at home:  Take or apply over-the-counter and prescription medicines only as told by your health care provider.  Wash exposed skin as soon as possible with soap and cold water.  Use hydrocortisone creams or calamine lotion as needed to soothe the skin and relieve itching.  Take oatmeal baths as needed. Use colloidal oatmeal. You can get this at your local pharmacy or grocery store. Follow the instructions on the packaging.  Do not scratch or rub your skin.  While you have the rash, wash clothes right after you wear them. How is this prevented?  Learn to identify the poison ivy plant and avoid contact with the plant. This plant can be recognized by the number of leaves. Generally, poison ivy has three leaves with flowering branches on a single stem. The leaves are typically glossy, and they have jagged edges that come to a point at the front.  If you have been exposed to poison ivy, thoroughly wash with soap and water right away. You have about 30 minutes to remove the plant resin before it will cause the rash. Be sure to wash under your fingernails because any plant resin there will continue to spread the rash.  When hiking or camping, wear clothes that will  help you to avoid exposure on the skin. This includes long pants, a long-sleeved shirt, tall socks, and hiking boots. You can also apply preventive lotion to your skin to help limit exposure.  If you suspect that your clothes or outdoor gear came in contact with poison ivy, rinse them off outside with a garden hose before you bring them inside your house. Contact a health care provider if:  You  have open sores in the rash area.  You have more redness, swelling, or pain in the affected area.  You have redness that spreads beyond the rash area.  You have fluid, blood, or pus coming from the affected area.  You have a fever.  You have a rash over a large area of your body.  You have a rash on your eyes, mouth, or genitals.  Your rash does not improve after a few days. Get help right away if:  Your face swells or your eyes swell shut.  You have trouble breathing.  You have trouble swallowing. This information is not intended to replace advice given to you by your health care provider. Make sure you discuss any questions you have with your health care provider. Document Released: 09/07/2000 Document Revised: 02/16/2016 Document Reviewed: 02/16/2015 Elsevier Interactive Patient Education  2018 [employer]   IF you received an x-ray today, you will receive an invoice from [facility]. Please contact [facility] at [phone] with questions or concerns regarding your invoice.   IF you received labwork today, you will receive an invoice from [facility]. Please contact [facility] at [phone] with questions or concerns regarding your invoice.   Our billing staff will not be able to assist you with questions regarding bills from these companies.  You will be contacted with the lab results as soon as they are available. The fastest way to get your results is to activate your My Chart account. Instructions are located on the last page of this paperwork. If you have not heard from us regarding the results in 2 weeks, please contact this office.

## 2017-04-06 ENCOUNTER — Encounter: Payer: Self-pay | Admitting: Family Medicine

## 2017-04-06 ENCOUNTER — Ambulatory Visit (INDEPENDENT_AMBULATORY_CARE_PROVIDER_SITE_OTHER): Payer: BLUE CROSS/BLUE SHIELD | Admitting: Family Medicine

## 2017-04-06 VITALS — BP 125/85 | HR 62 | Temp 98.2°F | Resp 16 | Ht 68.0 in | Wt 191.0 lb

## 2017-04-06 DIAGNOSIS — L237 Allergic contact dermatitis due to plants, except food: Secondary | ICD-10-CM

## 2017-04-06 DIAGNOSIS — R21 Rash and other nonspecific skin eruption: Secondary | ICD-10-CM

## 2017-04-06 MED ORDER — PREDNISONE 20 MG PO TABS: ORAL_TABLET | ORAL | 0 refills | Status: DC

## 2017-04-06 MED ORDER — PERMETHRIN 5 % EX CREA: 1.0000 "application " | TOPICAL_CREAM | Freq: Once | CUTANEOUS | 0 refills | Status: AC

## 2017-04-06 NOTE — Progress Notes (Signed)
By signing my name below, I, Mesha Guinyard, attest that this documentation has been prepared under the direction and in the presence of Meredith Staggers, MD.  Electronically Signed: Arvilla Market, Medical Scribe. 04/06/17. 10:43 AM.  Subjective:    Patient ID: [name], male    DOB: 1966/01/25, 51 y.o.   MRN: [id number]  HPI Chief Complaint  Patient presents with  . Rash    all over body, same no change    HPI Comments: Elroy Schembri is a 51 y.o. male who presents to Primary Care at [facility] for rash follow-up. He was seen June 18th, thought to have contact dermatitis with poison ivy. Initially treated with 9 days of prednisone taper. Initially rash had improved, but returned 3 days after stopping prednisone. Noted the rash on extremities, back of neck and face. There were a few patches of erythematous clusters, but no known bedbug exposure. Prednisone taper was restarted for an additional 9 days, and benadryl/zrytec was discussed.  Notes prednisone rx on the 30th (finished his prednisone rx 5 days ago) gave some relief of his sxs. The rash on his neck and face has resolved but he still has some itchy patches. Pt notes new spots on his left lower leg, and thighs onset 4 days ago, and a change in lip color 4 days ago. Pt has used benadryl on the weekend, zyrtec QD, itchy/hot gave relief of his itchiness, and he's also used calamine lotion for little relief of his sxs. Some days he took benadryl in the morning and zyrtec at night. Pt reports tasting the aftertaste of prednisone, but denies negative side effects from it. Denies mouth rash, and genital rash.  Patient Active Problem List   Diagnosis Date Noted  . Elevated cholesterol 09/07/2016  . LEG EDEMA, RIGHT 11/28/2010   History reviewed. No pertinent past medical history. Past Surgical History:  Procedure Laterality Date  . APPENDECTOMY    . HERNIA REPAIR    . NERVE SURGERY    . RIH     Allergies  Allergen Reactions  .  Prednisone Other (See Comments)    Changes color of mucus membrane in the mouth to brown    Prior to Admission medications   Not on File   Social History   Social History  . Marital status: Single    Spouse name: N/A  . Number of children: N/A  . Years of education: N/A   Occupational History  . Not on file.   Social History Main Topics  . Smoking status: Never Smoker  . Smokeless tobacco: Never Used  . Alcohol use Yes  . Drug use: No  . Sexual activity: No   Other Topics Concern  . Not on file   Social History Narrative  . No narrative on file   Review of Systems  HENT: Negative for mouth sores.   Genitourinary: Negative for genital sores.  Skin: Positive for color change and rash.   Objective:  Physical Exam  Constitutional: He appears well-developed and well-nourished. No distress.  HENT:  Head: Normocephalic and atraumatic.  Mouth/Throat: Oropharynx is clear and moist.  I do not appreciate significant color change of lips, or intraoral white patches/lesions.   Eyes: Conjunctivae are normal.  Neck: Neck supple.  Cardiovascular: Normal rate.   Pulmonary/Chest: Effort normal.  Neurological: He is alert.  Skin: Skin is warm and dry.  Erythematous patch of small possible vesicle on right anterior thigh Small erythmatous patch on right inguinal fold Healed area on left  medial thigh Scattered erythematous papules with clusters of those papules left anterior lower leg, slight excoriation Dry skin on posterior left calf Erythematous patches with excoriation on right mid back Slight hyperpigmentation on the neck but no lesions Arm: there are few faint erythematous lines of papules on upper and lower arms bilaterally and extends to the wrist Has some erythematous patches on the the right 3rd finger, base of left thumb, left 2nd finger, and right 5th finger There are no interdigital lesions  Psychiatric: He has a normal mood and affect. His behavior is normal.    Nursing note and vitals reviewed.   Vitals:   04/06/17 1026  BP: 125/85  Pulse: 62  Resp: 16  Temp: 98.2 F (36.8 C)  TempSrc: Oral  SpO2: 98%  Weight: 191 lb (86.6 kg)  Height: 5\' 8"  (1.727 m)   Body mass index is 29.04 kg/m. Assessment & Plan:    Anthony GuarneriOscar Cantave is a 51 y.o. male Rash and nonspecific skin eruption - Plan: Ambulatory referral to Dermatology  Contact dermatitis due to poison ivy - Plan: Ambulatory referral to Dermatology  Still appears to be poison ivy dermatitis as initially improved with prednisone, recurred after cessation, then some new lesions towards the end of most recent taper. No systemic symptoms. No intraoral lesions or genital lesions. Does have some areas on his fingers, differential includes scabies, but less likely.   - Will provide 1 more course of prednisone, keeping 60 mg dose on for an additional 2 days, then taper. Refer to dermatology for further evaluation next week.  - Reported change in pigmentation/color of lips, but I do not see any specific lesions present. RTC precautions if any further changes or oral patches/pain in mouth.  Meds ordered this encounter  Medications  . predniSONE (DELTASONE) 20 MG tablet    Sig: 3 by mouth for 5 days, then 2 by mouth for 2 days, then 1 by mouth for 2 days, then 1/2 by mouth for 2 days.    Dispense:  22 tablet    Refill:  0  . permethrin (ELIMITE) 5 % cream    Sig: Apply 1 application topically once.    Dispense:  60 g    Refill:  0   Patient Instructions   Your rash still appears to be poison ivy, especially with initial improvement on prednisone. Restart prednisone at 60 mg dose for the next 5 days, then taper down as we have done in the past. If you do not notice any improvement in the rash within the next 1 week, then fill the prescription for the Elimite cream that would treat scabies. I'm writing that based on the rash on your fingers, but otherwise the rash appears to be due to poison ivy. I  also referred you to dermatology as this rash has been persistent.  For itching, the prednisone should help, but you can also use over-the-counter Aveeno lotion, Sarna lotion, or hydrocortisone cream if needed  If you notice any white patches in the mouth or any increased soreness in the mouth, that could be from thrush or yeast infection, so follow-up to discuss treatment.  Return to the clinic or go to the nearest emergency room if any of your symptoms worsen or new symptoms occur.   Poison Ivy Dermatitis Poison ivy dermatitis is inflammation of the skin that is caused by the allergens on the leaves of the poison ivy plant. The skin reaction often involves redness, swelling, blisters, and extreme itching. What are  the causes? This condition is caused by a specific chemical (urushiol) found in the sap of the poison ivy plant. This chemical is sticky and can be easily spread to people, animals, and objects. You can get poison ivy dermatitis by:  Having direct contact with a poison ivy plant.  Touching animals, other people, or objects that have come in contact with poison ivy and have the chemical on them.  What increases the risk? This condition is more likely to develop in:  People who are outdoors often.  People who go outdoors without wearing protective clothing, such as closed shoes, long pants, and a long-sleeved shirt.  What are the signs or symptoms? Symptoms of this condition include:  Redness and itching.  A rash that often includes bumps and blisters. The rash usually appears 48 hours after exposure.  Swelling. This may occur if the reaction is more severe.  Symptoms usually last for 1-2 weeks. However, the first time you develop this condition, symptoms may last 3-4 weeks. How is this diagnosed? This condition may be diagnosed based on your symptoms and a physical exam. Your health care provider may also ask you about any recent outdoor activity. How is this  treated? Treatment for this condition will vary depending on how severe it is. Treatment may include:  Hydrocortisone creams or calamine lotions to relieve itching.  Oatmeal baths to soothe the skin.  Over-the-counter antihistamine tablets.  Oral steroid medicine for more severe outbreaks.  Follow these instructions at home:  Take or apply over-the-counter and prescription medicines only as told by your health care provider.  Wash exposed skin as soon as possible with soap and cold water.  Use hydrocortisone creams or calamine lotion as needed to soothe the skin and relieve itching.  Take oatmeal baths as needed. Use colloidal oatmeal. You can get this at your local pharmacy or grocery store. Follow the instructions on the packaging.  Do not scratch or rub your skin.  While you have the rash, wash clothes right after you wear them. How is this prevented?  Learn to identify the poison ivy plant and avoid contact with the plant. This plant can be recognized by the number of leaves. Generally, poison ivy has three leaves with flowering branches on a single stem. The leaves are typically glossy, and they have jagged edges that come to a point at the front.  If you have been exposed to poison ivy, thoroughly wash with soap and water right away. You have about 30 minutes to remove the plant resin before it will cause the rash. Be sure to wash under your fingernails because any plant resin there will continue to spread the rash.  When hiking or camping, wear clothes that will help you to avoid exposure on the skin. This includes long pants, a long-sleeved shirt, tall socks, and hiking boots. You can also apply preventive lotion to your skin to help limit exposure.  If you suspect that your clothes or outdoor gear came in contact with poison ivy, rinse them off outside with a garden hose before you bring them inside your house. Contact a health care provider if:  You have open sores in the  rash area.  You have more redness, swelling, or pain in the affected area.  You have redness that spreads beyond the rash area.  You have fluid, blood, or pus coming from the affected area.  You have a fever.  You have a rash over a large area of your body.  You have a rash on your eyes, mouth, or genitals.  Your rash does not improve after a few days. Get help right away if:  Your face swells or your eyes swell shut.  You have trouble breathing.  You have trouble swallowing. This information is not intended to replace advice given to you by your health care provider. Make sure you discuss any questions you have with your health care provider. Document Released: 09/07/2000 Document Revised: 02/16/2016 Document Reviewed: 02/16/2015 Elsevier Interactive Patient Education  2018 [employer].     IF you received an x-ray today, you will receive an invoice from [facility]. Please contact [facility] at [phone] with questions or concerns regarding your invoice.   IF you received labwork today, you will receive an invoice from [facility]. Please contact [facility] at [phone] with questions or concerns regarding your invoice.   Our billing staff will not be able to assist you with questions regarding bills from these companies.  You will be contacted with the lab results as soon as they are available. The fastest way to get your results is to activate your My Chart account. Instructions are located on the last page of this paperwork. If you have not heard from [place] regarding the results in 2 weeks, please contact this office.       I personally performed the services described in this documentation, which was scribed in my presence. The recorded information has been reviewed and considered for accuracy and completeness, addended by me as needed, and agree with information above.  Signed,   Meredith Staggers, MD Primary Care at [facility].  04/06/17 5:02 PM

## 2017-04-06 NOTE — Patient Instructions (Addendum)
Your rash still appears to be poison ivy, especially with initial improvement on prednisone. Restart prednisone at 60 mg dose for the next 5 days, then taper down as we have done in the past. If you do not notice any improvement in the rash within the next 1 week, then fill the prescription for the Elimite cream that would treat scabies. I'm writing that based on the rash on your fingers, but otherwise the rash appears to be due to poison ivy. I also referred you to dermatology as this rash has been persistent.  For itching, the prednisone should help, but you can also use over-the-counter Aveeno lotion, Sarna lotion, or hydrocortisone cream if needed  If you notice any white patches in the mouth or any increased soreness in the mouth, that could be from thrush or yeast infection, so follow-up to discuss treatment.  Return to the clinic or go to the nearest emergency room if any of your symptoms worsen or new symptoms occur.   Poison Ivy Dermatitis Poison ivy dermatitis is inflammation of the skin that is caused by the allergens on the leaves of the poison ivy plant. The skin reaction often involves redness, swelling, blisters, and extreme itching. What are the causes? This condition is caused by a specific chemical (urushiol) found in the sap of the poison ivy plant. This chemical is sticky and can be easily spread to people, animals, and objects. You can get poison ivy dermatitis by:  Having direct contact with a poison ivy plant.  Touching animals, other people, or objects that have come in contact with poison ivy and have the chemical on them.  What increases the risk? This condition is more likely to develop in:  People who are outdoors often.  People who go outdoors without wearing protective clothing, such as closed shoes, long pants, and a long-sleeved shirt.  What are the signs or symptoms? Symptoms of this condition include:  Redness and itching.  A rash that often includes  bumps and blisters. The rash usually appears 48 hours after exposure.  Swelling. This may occur if the reaction is more severe.  Symptoms usually last for 1-2 weeks. However, the first time you develop this condition, symptoms may last 3-4 weeks. How is this diagnosed? This condition may be diagnosed based on your symptoms and a physical exam. Your health care provider may also ask you about any recent outdoor activity. How is this treated? Treatment for this condition will vary depending on how severe it is. Treatment may include:  Hydrocortisone creams or calamine lotions to relieve itching.  Oatmeal baths to soothe the skin.  Over-the-counter antihistamine tablets.  Oral steroid medicine for more severe outbreaks.  Follow these instructions at home:  Take or apply over-the-counter and prescription medicines only as told by your health care provider.  Wash exposed skin as soon as possible with soap and cold water.  Use hydrocortisone creams or calamine lotion as needed to soothe the skin and relieve itching.  Take oatmeal baths as needed. Use colloidal oatmeal. You can get this at your local pharmacy or grocery store. Follow the instructions on the packaging.  Do not scratch or rub your skin.  While you have the rash, wash clothes right after you wear them. How is this prevented?  Learn to identify the poison ivy plant and avoid contact with the plant. This plant can be recognized by the number of leaves. Generally, poison ivy has three leaves with flowering branches on a single stem. The  leaves are typically glossy, and they have jagged edges that come to a point at the front.  If you have been exposed to poison ivy, thoroughly wash with soap and water right away. You have about 30 minutes to remove the plant resin before it will cause the rash. Be sure to wash under your fingernails because any plant resin there will continue to spread the rash.  When hiking or camping, wear  clothes that will help you to avoid exposure on the skin. This includes long pants, a long-sleeved shirt, tall socks, and hiking boots. You can also apply preventive lotion to your skin to help limit exposure.  If you suspect that your clothes or outdoor gear came in contact with poison ivy, rinse them off outside with a garden hose before you bring them inside your house. Contact a health care provider if:  You have open sores in the rash area.  You have more redness, swelling, or pain in the affected area.  You have redness that spreads beyond the rash area.  You have fluid, blood, or pus coming from the affected area.  You have a fever.  You have a rash over a large area of your body.  You have a rash on your eyes, mouth, or genitals.  Your rash does not improve after a few days. Get help right away if:  Your face swells or your eyes swell shut.  You have trouble breathing.  You have trouble swallowing. This information is not intended to replace advice given to you by your health care provider. Make sure you discuss any questions you have with your health care provider. Document Released: 09/07/2000 Document Revised: 02/16/2016 Document Reviewed: 02/16/2015 Elsevier Interactive Patient Education  2018 [employer]     IF you received an x-ray today, you will receive an invoice from [facility]. Please contact [facility] at [phone] with questions or concerns regarding your invoice.   IF you received labwork today, you will receive an invoice from [facility]. Please contact [facility] at [phone] with questions or concerns regarding your invoice.   Our billing staff will not be able to assist you with questions regarding bills from these companies.  You will be contacted with the lab results as soon as they are available. The fastest way to get your results is to activate your My Chart account. Instructions are located on the last page of this  paperwork. If you have not heard from us regarding the results in 2 weeks, please contact this office.

## 2017-06-28 ENCOUNTER — Ambulatory Visit (INDEPENDENT_AMBULATORY_CARE_PROVIDER_SITE_OTHER): Payer: BLUE CROSS/BLUE SHIELD | Admitting: Family Medicine

## 2017-06-28 ENCOUNTER — Encounter: Payer: Self-pay | Admitting: Family Medicine

## 2017-06-28 VITALS — HR 72 | Temp 98.4°F | Resp 16 | Ht 67.0 in | Wt 191.4 lb

## 2017-06-28 DIAGNOSIS — Z1211 Encounter for screening for malignant neoplasm of colon: Secondary | ICD-10-CM | POA: Diagnosis not present

## 2017-06-28 DIAGNOSIS — Z125 Encounter for screening for malignant neoplasm of prostate: Secondary | ICD-10-CM

## 2017-06-28 DIAGNOSIS — Z0000 Encounter for general adult medical examination without abnormal findings: Secondary | ICD-10-CM | POA: Diagnosis not present

## 2017-06-28 DIAGNOSIS — E781 Pure hyperglyceridemia: Secondary | ICD-10-CM

## 2017-06-28 DIAGNOSIS — R12 Heartburn: Secondary | ICD-10-CM

## 2017-06-28 DIAGNOSIS — Z23 Encounter for immunization: Secondary | ICD-10-CM | POA: Diagnosis not present

## 2017-06-28 NOTE — Patient Instructions (Addendum)
See foods to avoid with heartburn, but if needed you can take over the counter zantac, pepcid, or omeprazole as needed. If meds needed daily - return to discuss further.  Flu vaccine and tdap were both given today.   Food Choices for Gastroesophageal Reflux Disease, Adult When you have gastroesophageal reflux disease (GERD), the foods you eat and your eating habits are very important. Choosing the right foods can help ease the discomfort of GERD. Consider working with a diet and nutrition specialist (dietitian) to help you make healthy food choices. What general guidelines should I follow? Eating plan  Choose healthy foods low in fat, such as fruits, vegetables, whole grains, low-fat dairy products, and lean meat, fish, and poultry.  Eat frequent, small meals instead of three large meals each day. Eat your meals slowly, in a relaxed setting. Avoid bending over or lying down until 2-3 hours after eating.  Limit high-fat foods such as fatty meats or fried foods.  Limit your intake of oils, butter, and shortening to less than 8 teaspoons each day.  Avoid the following: ? Foods that cause symptoms. These may be different for different people. Keep a food diary to keep track of foods that cause symptoms. ? Alcohol. ? Drinking large amounts of liquid with meals. ? Eating meals during the 2-3 hours before bed.  Cook foods using methods other than frying. This may include baking, grilling, or broiling. Lifestyle   Maintain a healthy weight. Ask your health care provider what weight is healthy for you. If you need to lose weight, work with your health care provider to do so safely.  Exercise for at least 30 minutes on 5 or more days each week, or as told by your health care provider.  Avoid wearing clothes that fit tightly around your waist and chest.  Do not use any products that contain nicotine or tobacco, such as cigarettes and e-cigarettes. If you need help quitting, ask your health  care provider.  Sleep with the head of your bed raised. Use a wedge under the mattress or blocks under the bed frame to raise the head of the bed. What foods are not recommended? The items listed may not be a complete list. Talk with your dietitian about what dietary choices are best for you. Grains Pastries or quick breads with added fat. [place] toast. Vegetables Deep fried vegetables. [place] fries. Any vegetables prepared with added fat. Any vegetables that cause symptoms. For some people this may include tomatoes and tomato products, chili peppers, onions and garlic, and horseradish. Fruits Any fruits prepared with added fat. Any fruits that cause symptoms. For some people this may include citrus fruits, such as oranges, grapefruit, pineapple, and lemons. Meats and other protein foods High-fat meats, such as fatty beef or pork, hot dogs, ribs, ham, sausage, salami and bacon. Fried meat or protein, including fried fish and fried chicken. Nuts and nut butters. Dairy Whole milk and chocolate milk. Sour cream. Cream. Ice cream. Cream cheese. Milk shakes. Beverages Coffee and tea, with or without caffeine. Carbonated beverages. Sodas. Energy drinks. Fruit juice made with acidic fruits (such as orange or grapefruit). Tomato juice. Alcoholic drinks. Fats and oils Butter. Margarine. Shortening. Ghee. Sweets and desserts Chocolate and cocoa. Donuts. Seasoning and other foods Pepper. Peppermint and spearmint. Any condiments, herbs, or seasonings that cause symptoms. For some people, this may include curry, hot sauce, or vinegar-based salad dressings. Summary  When you have gastroesophageal reflux disease (GERD), food and lifestyle choices are very  important to help ease the discomfort of GERD.  Eat frequent, small meals instead of three large meals each day. Eat your meals slowly, in a relaxed setting. Avoid bending over or lying down until 2-3 hours after eating.  Limit high-fat foods such as  fatty meat or fried foods. This information is not intended to replace advice given to you by your health care provider. Make sure you discuss any questions you have with your health care provider. Document Released: 09/10/2005 Document Revised: 09/11/2016 Document Reviewed: 09/11/2016 Elsevier Interactive Patient Education  2017 [employer].      IF you received an x-ray today, you will receive an invoice from [facility]. Please contact [facility] at [phone] with questions or concerns regarding your invoice.   IF you received labwork today, you will receive an invoice from [facility]. Please contact [facility] at [phone] with questions or concerns regarding your invoice.   Our billing staff will not be able to assist you with questions regarding bills from these companies.  You will be contacted with the lab results as soon as they are available. The fastest way to get your results is to activate your My Chart account. Instructions are located on the last page of this paperwork. If you have not heard from [place] regarding the results in 2 weeks, please contact this office.

## 2017-06-28 NOTE — Progress Notes (Signed)
Subjective:  By signing my name below, I, Essence Howell, attest that this documentation has been prepared under the direction and in the presence of Shade Flood, MD Electronically Signed: Charline Bills, ED Scribe 06/28/2017 at 8:15 AM.   Patient ID: [name], male    DOB: June 17, 1966, 51 y.o.   MRN: [id number]  Chief Complaint  Patient presents with  . Annual Exam   HPI [name] is a 51 y.o. male who presents to Primary Care at [facility] for an annual exam. Pt is fasting at this visit.  Hyperlipidemia Lab Results  Component Value Date   CHOL 182 09/07/2016   HDL 36 (L) 09/07/2016   LDLCALC 113 (H) 09/07/2016   TRIG 167 (H) 09/07/2016   CHOLHDL 5.1 (H) 09/07/2016   Lab Results  Component Value Date   ALT 53 02/14/2015   AST 30 02/14/2015   ALKPHOS 76 02/14/2015   BILITOT 0.4 02/14/2015  Triglycerides had improved from 298 to 167. Diet and exercise was discussed.   Heartburn  Pt used to take Omeprazole for intermittent heartburn but stopped ~1 year ago. States he is now taking Tums daily for breakthrough heartburn which he is experiencing once a week. H/o H.pylori.  CA Screening Colonoscopy: pt has never had a colonoscopy Prostate CA Screening: pt agrees to DRE at this visit No results found for: PSA  Immunizations There is no immunization history for the selected administration types on file for this patient.   Depression Screening Depression screen [facility] 2/9 06/28/2017 04/06/2017 03/23/2017 03/11/2017 12/10/2016  Decreased Interest 0 0 0 0 0  Down, Depressed, Hopeless 0 0 0 0 0  PHQ - 2 Score 0 0 0 0 0  Altered sleeping - - - - -  Tired, decreased energy - - - - -  Change in appetite - - - - -  Feeling bad or failure about yourself  - - - - -  Trouble concentrating - - - - -  Moving slowly or fidgety/restless - - - - -  Suicidal thoughts - - - - -  PHQ-9 Score - - - - -     Visual Acuity Screening   Right eye Left eye Both eyes  Without correction:   With correction:      Vision: pt wears glasses but was not wearing them today; has an appointment with Dr. Dione Booze in 2019 Dentist: sees a dentist every 6 months Exercise: 2-3 days/week  Patient Active Problem List   Diagnosis Date Noted  . Elevated cholesterol 09/07/2016  . LEG EDEMA, RIGHT 11/28/2010   History reviewed. No pertinent past medical history. Past Surgical History:  Procedure Laterality Date  . APPENDECTOMY    . HERNIA REPAIR    . KNEE ARTHROSCOPY W/ MENISCAL REPAIR Left   . NERVE SURGERY    . RIH     No Active Allergies Prior to Admission medications   Not on File   Social History   Social History  . Marital status: Single    Spouse name: N/A  . Number of children: N/A  . Years of education: N/A   Occupational History  . Not on file.   Social History Main Topics  . Smoking status: Never Smoker  . Smokeless tobacco: Never Used  . Alcohol use Yes  . Drug use: No  . Sexual activity: No   Other Topics Concern  . Not on file   Social History Narrative  . No narrative on file  Review of Systems  All other systems reviewed and are negative. 13 point ROS was negative    Objective:   Physical Exam  Constitutional: He is oriented to person, place, and time. He appears well-developed and well-nourished.  HENT:  Head: Normocephalic and atraumatic.  Right Ear: External ear normal.  Left Ear: External ear normal.  Mouth/Throat: Oropharynx is clear and moist.  Eyes: Pupils are equal, round, and reactive to light. Conjunctivae and EOM are normal.  Neck: Normal range of motion. Neck supple. No thyromegaly present.  Cardiovascular: Normal rate, regular rhythm, normal heart sounds and intact distal pulses.   Pulmonary/Chest: Effort normal and breath sounds normal. No respiratory distress. He has no wheezes.  Abdominal: Soft. He exhibits no distension. There is no tenderness. Hernia confirmed negative in the right inguinal area and  confirmed negative in the left inguinal area.  Genitourinary: Prostate normal.  Musculoskeletal: Normal range of motion. He exhibits no edema or tenderness.  Lymphadenopathy:    He has no cervical adenopathy.  Neurological: He is alert and oriented to person, place, and time. He has normal reflexes.  Skin: Skin is warm and dry.  Psychiatric: He has a normal mood and affect. His behavior is normal.  Vitals reviewed.    Vitals:   06/28/17 0800  Pulse: 72  Resp: 16  Temp: 98.4 F (36.9 C)  SpO2: 98%  Weight: 191 lb 6.4 oz (86.8 kg)  Height:  (1.702 m)      Assessment & Plan:   [name] is a 51 y.o. male Annual physical exam  - -anticipatory guidance as below in AVS, screening labs above. Health maintenance items as above in HPI discussed/recommended as applicable.   Heartburn  - Trigger avoidance discussed, over-the-counter treatment if needed, RTC precautions if persistent need for medication.  Screen for colon cancer - Plan: Ambulatory referral to Gastroenterology  Need for Tdap vaccination - Plan: Tdap vaccine greater than or equal to 7yo IM  Needs flu shot - Plan: Flu Vaccine QUAD 36+ mos IM  Screening for prostate cancer - Plan: PSA  - We discussed pros and cons of prostate cancer screening, and after this discussion, he chose to have screening done. PSA obtained, and no concerning findings on DRE.   Hypertriglyceridemia - Plan: Comprehensive metabolic panel, Lipid panel  - repeat labs.   No orders of the defined types were placed in this encounter.  Patient Instructions     See foods to avoid with heartburn, but if needed you can take over the counter zantac, pepcid, or omeprazole as needed. If meds needed daily - return to discuss further.  Flu vaccine and tdap were both given today.   Food Choices for Gastroesophageal Reflux Disease, Adult When you have gastroesophageal reflux disease (GERD), the foods you eat and your eating habits are very  important. Choosing the right foods can help ease the discomfort of GERD. Consider working with a diet and nutrition specialist (dietitian) to help you make healthy food choices. What general guidelines should I follow? Eating plan  Choose healthy foods low in fat, such as fruits, vegetables, whole grains, low-fat dairy products, and lean meat, fish, and poultry.  Eat frequent, small meals instead of three large meals each day. Eat your meals slowly, in a relaxed setting. Avoid bending over or lying down until 2-3 hours after eating.  Limit high-fat foods such as fatty meats or fried foods.  Limit your intake of oils, butter, and shortening to less than 8 teaspoons  each day.  Avoid the following: ? Foods that cause symptoms. These may be different for different people. Keep a food diary to keep track of foods that cause symptoms. ? Alcohol. ? Drinking large amounts of liquid with meals. ? Eating meals during the 2-3 hours before bed.  Cook foods using methods other than frying. This may include baking, grilling, or broiling. Lifestyle   Maintain a healthy weight. Ask your health care provider what weight is healthy for you. If you need to lose weight, work with your health care provider to do so safely.  Exercise for at least 30 minutes on 5 or more days each week, or as told by your health care provider.  Avoid wearing clothes that fit tightly around your waist and chest.  Do not use any products that contain nicotine or tobacco, such as cigarettes and e-cigarettes. If you need help quitting, ask your health care provider.  Sleep with the head of your bed raised. Use a wedge under the mattress or blocks under the bed frame to raise the head of the bed. What foods are not recommended? The items listed may not be a complete list. Talk with your dietitian about what dietary choices are best for you. Grains Pastries or quick breads with added fat. [place] toast. Vegetables Deep fried  vegetables. [place] fries. Any vegetables prepared with added fat. Any vegetables that cause symptoms. For some people this may include tomatoes and tomato products, chili peppers, onions and garlic, and horseradish. Fruits Any fruits prepared with added fat. Any fruits that cause symptoms. For some people this may include citrus fruits, such as oranges, grapefruit, pineapple, and lemons. Meats and other protein foods High-fat meats, such as fatty beef or pork, hot dogs, ribs, ham, sausage, salami and bacon. Fried meat or protein, including fried fish and fried chicken. Nuts and nut butters. Dairy Whole milk and chocolate milk. Sour cream. Cream. Ice cream. Cream cheese. Milk shakes. Beverages Coffee and tea, with or without caffeine. Carbonated beverages. Sodas. Energy drinks. Fruit juice made with acidic fruits (such as orange or grapefruit). Tomato juice. Alcoholic drinks. Fats and oils Butter. Margarine. Shortening. Ghee. Sweets and desserts Chocolate and cocoa. Donuts. Seasoning and other foods Pepper. Peppermint and spearmint. Any condiments, herbs, or seasonings that cause symptoms. For some people, this may include curry, hot sauce, or vinegar-based salad dressings. Summary  When you have gastroesophageal reflux disease (GERD), food and lifestyle choices are very important to help ease the discomfort of GERD.  Eat frequent, small meals instead of three large meals each day. Eat your meals slowly, in a relaxed setting. Avoid bending over or lying down until 2-3 hours after eating.  Limit high-fat foods such as fatty meat or fried foods. This information is not intended to replace advice given to you by your health care provider. Make sure you discuss any questions you have with your health care provider. Document Released: 09/10/2005 Document Revised: 09/11/2016 Document Reviewed: 09/11/2016 Elsevier Interactive Patient Education  2017 [employer].      IF you received an x-ray  today, you will receive an invoice from [facility]. Please contact [facility] at [phone] with questions or concerns regarding your invoice.   IF you received labwork today, you will receive an invoice from [place]. Please contact [facility] at [phone] with questions or concerns regarding your invoice.   Our billing staff will not be able to assist you with questions regarding bills from these companies.  You will be contacted with  the lab results as soon as they are available. The fastest way to get your results is to activate your My Chart account. Instructions are located on the last page of this paperwork. If you have not heard from [place] regarding the results in 2 weeks, please contact this office.       I personally performed the services described in this documentation, which was scribed in my presence. The recorded information has been reviewed and considered for accuracy and completeness, addended by me as needed, and agree with information above.  Signed,   Meredith Staggers, MD Primary Care at [facility].  06/28/17 9:34 AM

## 2017-06-29 LAB — LIPID PANEL
CHOL/HDL RATIO: 5.1 ratio — AB (ref 0.0–5.0)
Cholesterol, Total: 200 mg/dL — ABNORMAL HIGH (ref 100–199)
HDL: 39 mg/dL — ABNORMAL LOW (ref >39)
LDL CALC: 123 mg/dL — AB (ref 0–99)
Triglycerides: 188 mg/dL — ABNORMAL HIGH (ref 0–149)
VLDL CHOLESTEROL CAL: 38 mg/dL (ref 5–40)

## 2017-06-29 LAB — COMPREHENSIVE METABOLIC PANEL
ALBUMIN: 4.5 g/dL (ref 3.5–5.5)
ALT: 37 IU/L (ref 0–44)
AST: 26 IU/L (ref 0–40)
Albumin/Globulin Ratio: 1.8 (ref 1.2–2.2)
Alkaline Phosphatase: 68 IU/L (ref 39–117)
BUN / CREAT RATIO: 17 (ref 9–20)
BUN: 19 mg/dL (ref 6–24)
Bilirubin Total: 0.6 mg/dL (ref 0.0–1.2)
CALCIUM: 9.4 mg/dL (ref 8.7–10.2)
CO2: 23 mmol/L (ref 20–29)
CREATININE: 1.11 mg/dL (ref 0.76–1.27)
Chloride: 103 mmol/L (ref 96–106)
GFR calc Af Amer: 88 mL/min/{1.73_m2} (ref >59)
GFR, EST NON AFRICAN AMERICAN: 76 mL/min/{1.73_m2} (ref >59)
GLOBULIN, TOTAL: 2.5 g/dL (ref 1.5–4.5)
GLUCOSE: 93 mg/dL (ref 65–99)
Potassium: 4.1 mmol/L (ref 3.5–5.2)
SODIUM: 141 mmol/L (ref 134–144)
TOTAL PROTEIN: 7 g/dL (ref 6.0–8.5)

## 2017-06-29 LAB — PSA: Prostate Specific Ag, Serum: 0.5 ng/mL (ref 0.0–4.0)

## 2017-07-04 ENCOUNTER — Encounter: Payer: Self-pay | Admitting: Gastroenterology

## 2017-07-10 ENCOUNTER — Encounter: Payer: Self-pay | Admitting: Radiology

## 2017-08-29 ENCOUNTER — Ambulatory Visit: Payer: BLUE CROSS/BLUE SHIELD | Admitting: Gastroenterology

## 2017-08-29 ENCOUNTER — Encounter (INDEPENDENT_AMBULATORY_CARE_PROVIDER_SITE_OTHER): Payer: Self-pay

## 2017-08-29 ENCOUNTER — Encounter: Payer: Self-pay | Admitting: Gastroenterology

## 2017-08-29 VITALS — BP 110/80 | HR 68 | Ht 67.75 in | Wt 191.5 lb

## 2017-08-29 DIAGNOSIS — G8929 Other chronic pain: Secondary | ICD-10-CM

## 2017-08-29 DIAGNOSIS — K219 Gastro-esophageal reflux disease without esophagitis: Secondary | ICD-10-CM

## 2017-08-29 DIAGNOSIS — Z1211 Encounter for screening for malignant neoplasm of colon: Secondary | ICD-10-CM

## 2017-08-29 DIAGNOSIS — Z8619 Personal history of other infectious and parasitic diseases: Secondary | ICD-10-CM

## 2017-08-29 DIAGNOSIS — R1013 Epigastric pain: Secondary | ICD-10-CM | POA: Diagnosis not present

## 2017-08-29 MED ORDER — PEG-KCL-NACL-NASULF-NA ASC-C 140 G PO SOLR: 140.0000 g | ORAL | 0 refills | Status: DC

## 2017-08-29 NOTE — Progress Notes (Signed)
[facility] Consult Note:  History: Anthony GuarneriOscar Cook 08/29/2017  Referring physician: Shade FloodGreene, Jeffrey R, MD  Reason for consult/chief complaint: Colon Cancer Screening (to discuss having a ECL) and hx of H pylori   Subjective  HPI:  This is a 51 year old man referred by primary care for consideration of colon cancer screening and also for dyspepsia and reflux symptoms.  He reports about a decade of intermittent epigastric pain or heartburn that generally responds to omeprazole.  He has taken that for various periods of time over the years.  It seems that he was treated for H. pylori back in 2015.  He does not seem to have had any follow-up testing to confirm her medication.  Jari FavreOscar describes intermittent epigastric burning or feeling of regurgitation or burning in the chest.  It often responds to Tums.  He denies dysphagia, odynophagia, nausea, vomiting, early satiety or weight loss.  He denies rectal pain change in bowel habits or rectal bleeding.  He has no family history of esophageal, gastric or colon cancer.  ROS:  Review of Systems  Constitutional: Negative for appetite change and unexpected weight change.  HENT: Negative for mouth sores and voice change.   Eyes: Negative for pain and redness.  Respiratory: Negative for cough and shortness of breath.   Cardiovascular: Negative for chest pain and palpitations.  Genitourinary: Negative for dysuria and hematuria.  Musculoskeletal: Negative for arthralgias and myalgias.  Skin: Negative for pallor and rash.  Neurological: Negative for weakness and headaches.  Hematological: Negative for adenopathy.     Past Medical History: Past Medical History:  Diagnosis Date  . Anxiety      Past Surgical History: Past Surgical History:  Procedure Laterality Date  . APPENDECTOMY    . HERNIA REPAIR    . KNEE ARTHROSCOPY W/ MENISCAL REPAIR Left   . NERVE SURGERY    . RIH       Family History: Family History    Problem Relation Age of Onset  . Ovarian cancer Mother   . Alzheimer's disease Father [id number]  . Heart disease Sister   . Other Brother        Malaria  . Heart Problems Brother   . Alcohol abuse Brother   . Thyroid cancer Sister     Social History: Social History   Socioeconomic History  . Marital status: Single    Spouse name: None  . Number of children: 0  . Years of education: None  . Highest education level: None  Social Needs  . Financial resource strain: None  . Food insecurity - worry: None  . Food insecurity - inability: None  . Transportation needs - medical: None  . Transportation needs - non-medical: None  Occupational History  . None  Tobacco Use  . Smoking status: Never Smoker  . Smokeless tobacco: Never Used  Substance and Sexual Activity  . Alcohol use: Yes    Comment: 1-2 per day  . Drug use: No  . Sexual activity: No    Birth control/protection: Abstinence  Other Topics Concern  . None  Social History Narrative  . None   Works at [employer]  Allergies: No Active Allergies  Outpatient Meds: Current Outpatient Medications  Medication Sig Dispense Refill  . GLUCOSAMINE-CHONDROITIN PO Take 1 tablet by mouth daily.    . Multiple Vitamins-Minerals (CENTRUM PO) Take 1 tablet by mouth daily.    . Omega-3 Fatty Acids (OMEGA 3 PO) Take 1 tablet by mouth as needed.    .Marland Kitchen  PEG-KCl-NaCl-NaSulf-Na Asc-C (PLENVU) 140 g SOLR Take 140 g by mouth as directed. 1 each 0   No current facility-administered medications for this visit.       ___________________________________________________________________ Objective   Exam:  BP 110/80 (BP Location: Left Arm, Patient Position: Sitting, Cuff Size: Normal)   Pulse 68   Ht 5' 7.75" (1.721 m) Comment: height measured without shoes  Wt 191 lb 8 oz (86.9 kg)   BMI 29.33 kg/m    General: this is a(n) well-appearing man, normal vocal quality good muscle mass  Eyes: sclera anicteric, no redness  ENT: oral  mucosa moist without lesions, no cervical or supraclavicular lymphadenopathy, good dentition  CV: RRR without murmur, S1/S2, no JVD, no peripheral edema  Resp: clear to auscultation bilaterally, normal RR and effort noted  GI: soft, no tenderness, with active bowel sounds. No guarding or palpable organomegaly noted.  Skin; warm and dry, no rash or jaundice noted  Neuro: awake, alert and oriented x 3. Normal gross motor function and fluent speech  Labs:  No recent CBC  CMP Latest Ref Rng & Units 06/28/2017 02/14/2015 04/05/2014  Glucose 65 - 99 mg/dL 93 161(W103(H) 86  BUN 6 - 24 mg/dL 19 11 16   Creatinine 0.76 - 1.27 mg/dL 9.601.11 4.540.83 0.980.92  Sodium 134 - 144 mmol/L 141 141 138  Potassium 3.5 - 5.2 mmol/L 4.1 4.6 4.1  Chloride 96 - 106 mmol/L 103 103 103  CO2 20 - 29 mmol/L 23 29 28   Calcium 8.7 - 10.2 mg/dL 9.4 9.1 9.3  Total Protein 6.0 - 8.5 g/dL 7.0 6.6 7.0  Total Bilirubin 0.0 - 1.2 mg/dL 0.6 0.4 0.5  Alkaline Phos 39 - 117 IU/L 68 76 70  AST 0 - 40 IU/L 26 30 20   ALT 0 - 44 IU/L 37 53 30     Assessment: Encounter Diagnoses  Name Primary?  . Gastroesophageal reflux disease, esophagitis presence not specified Yes  . Special screening for malignant neoplasms, colon   . Abdominal pain, chronic, epigastric   . History of Helicobacter pylori infection     His GERD seems to respond well to meds, his H. pylori status is currently unknown.  Duration of reflux symptoms warrant screening for Barrett's esophagus.  There are no red flag symptoms.  He is at average risk for colorectal cancer.  Plan:  EGD and colonoscopy  The benefits and risks of the planned procedure were described in detail with the patient or (when appropriate) their health care proxy.  Risks were outlined as including, but not limited to, bleeding, infection, perforation, adverse medication reaction leading to cardiac or pulmonary decompensation, or pancreatitis (if ERCP).  The limitation of incomplete mucosal  visualization was also discussed.  No guarantees or warranties were given.   Thank you for the courtesy of this consult.  Please call me with any questions or concerns.  Charlie PitterHenry L Danis III  CC: Shade FloodGreene, Jeffrey R, MD

## 2017-08-29 NOTE — Patient Instructions (Signed)
If you are age 51 or older, your body mass index should be between 23-30. Your Body mass index is 29.33 kg/m. If this is out of the aforementioned range listed, please consider follow up with your Primary Care Provider.  If you are age 51 or younger, your body mass index should be between 19-25. Your Body mass index is 29.33 kg/m. If this is out of the aformentioned range listed, please consider follow up with your Primary Care Provider.   You have been scheduled for an endoscopy and colonoscopy. Please follow the written instructions given to you at your visit today. Please pick up your prep supplies at the pharmacy within the next 1-3 days. If you use inhalers (even only as needed), please bring them with you on the day of your procedure. Your physician has requested that you go to www.startemmi.com and enter the access code given to you at your visit today. This web site gives a general overview about your procedure. However, you should still follow specific instructions given to you by our office regarding your preparation for the procedure.  Thank you for choosing [facility]  Dr Amada JupiterHenry Danis III

## 2017-09-10 ENCOUNTER — Telehealth: Payer: Self-pay | Admitting: Gastroenterology

## 2017-09-11 NOTE — Telephone Encounter (Signed)
Left a voicemail for the pt to come to the office for his free sample kit of Plenvu.

## 2017-10-11 ENCOUNTER — Other Ambulatory Visit: Payer: Self-pay

## 2017-10-11 ENCOUNTER — Ambulatory Visit (AMBULATORY_SURGERY_CENTER): Payer: BLUE CROSS/BLUE SHIELD | Admitting: Gastroenterology

## 2017-10-11 ENCOUNTER — Encounter: Payer: Self-pay | Admitting: Gastroenterology

## 2017-10-11 VITALS — BP 100/75 | HR 57 | Temp 99.1°F | Resp 13 | Ht 67.75 in | Wt 191.0 lb

## 2017-10-11 DIAGNOSIS — Z1212 Encounter for screening for malignant neoplasm of rectum: Secondary | ICD-10-CM | POA: Diagnosis not present

## 2017-10-11 DIAGNOSIS — K219 Gastro-esophageal reflux disease without esophagitis: Secondary | ICD-10-CM | POA: Diagnosis not present

## 2017-10-11 DIAGNOSIS — Z8619 Personal history of other infectious and parasitic diseases: Secondary | ICD-10-CM | POA: Diagnosis not present

## 2017-10-11 DIAGNOSIS — Z1211 Encounter for screening for malignant neoplasm of colon: Secondary | ICD-10-CM | POA: Diagnosis not present

## 2017-10-11 DIAGNOSIS — R1013 Epigastric pain: Secondary | ICD-10-CM

## 2017-10-11 MED ORDER — SODIUM CHLORIDE 0.9 % IV SOLN: 500.0000 mL | Freq: Once | INTRAVENOUS | Status: DC

## 2017-10-11 NOTE — Op Note (Signed)
[facility] Patient Name: Anthony GuarneriOscar Cook Procedure Date: 10/11/2017 1:12 PM MRN: [id number] Endoscopist: Sherilyn CooterHenry L. Myrtie Neitheranis , MD Age: [age] Referring MD:  Date of Birth: 08/30/1966 Gender: Male Account #: [id number] Procedure:                Colonoscopy Indications:              Screening for colorectal malignant neoplasm, This                            is the patient's first colonoscopy Medicines:                Monitored Anesthesia Care Procedure:                Pre-Anesthesia Assessment:                           - Prior to the procedure, a History and Physical                            was performed, and patient medications and                            allergies were reviewed. The patient's tolerance of                            previous anesthesia was also reviewed. The risks                            and benefits of the procedure and the sedation                            options and risks were discussed with the patient.                            All questions were answered, and informed consent                            was obtained. Prior Anticoagulants: The patient has                            taken no previous anticoagulant or antiplatelet                            agents. ASA Grade Assessment: II - A patient with                            mild systemic disease. After reviewing the risks                            and benefits, the patient was deemed in                            satisfactory condition to undergo the procedure.  After obtaining informed consent, the colonoscope                            was passed under direct vision. Throughout the                            procedure, the patient's blood pressure, pulse, and                            oxygen saturations were monitored continuously. The                            Colonoscope was introduced through the anus and                            advanced to the the cecum,  identified by                            appendiceal orifice and ileocecal valve. The                            colonoscopy was performed without difficulty. The                            patient tolerated the procedure well. The quality                            of the bowel preparation was excellent. The                            ileocecal valve, appendiceal orifice, and rectum                            were photographed. The quality of the bowel                            preparation was evaluated using the BBPS [facility]                            Bowel Preparation Scale) with scores of: Right                            Colon = 3, Transverse Colon = 3 and Left Colon = 3                            (entire mucosa seen well with no residual staining,                            small fragments of stool or opaque liquid). The                            total BBPS score equals 9. Scope In: 1:25:02 PM Scope Out: 1:35:44 PM Scope Withdrawal Time: 0 hours 8  minutes 2 seconds  Total Procedure Duration: 0 hours 10 minutes 42 seconds  Findings:                 The perianal and digital rectal examinations were                            normal except for a palpable posterior                            hypertrophied anal papilla.                           One hypertrophied anal papilla was seen.                           The exam was otherwise without abnormality on                            direct and retroflexion views. Complications:            No immediate complications. Estimated Blood Loss:     Estimated blood loss: none. Impression:               - Anal papilla(e) were hypertrophied.                           - The examination was otherwise normal on direct                            and retroflexion views.                           - No specimens collected. Recommendation:           - Patient has a contact number available for                            emergencies. The signs and  symptoms of potential                            delayed complications were discussed with the                            patient. Return to normal activities tomorrow.                            Written discharge instructions were provided to the                            patient.                           - Resume previous diet.                           - Continue present medications.                           -  Repeat colonoscopy in 10 years for screening                            purposes. [name], MD 10/11/2017 1:49:30 PM This report has been signed electronically.

## 2017-10-11 NOTE — Patient Instructions (Signed)
YOU HAD AN ENDOSCOPIC PROCEDURE TODAY AT THE [facility]:   Refer to the procedure report that was given to you for any specific questions about what was found during the examination.  If the procedure report does not answer your questions, please call your gastroenterologist to clarify.  If you requested that your care partner not be given the details of your procedure findings, then the procedure report has been included in a sealed envelope for you to review at your convenience later.  YOU SHOULD EXPECT: Some feelings of bloating in the abdomen. Passage of more gas than usual.  Walking can help get rid of the air that was put into your GI tract during the procedure and reduce the bloating. If you had a lower endoscopy (such as a colonoscopy or flexible sigmoidoscopy) you may notice spotting of blood in your stool or on the toilet paper. If you underwent a bowel prep for your procedure, you may not have a normal bowel movement for a few days.  Please Note:  You might notice some irritation and congestion in your nose or some drainage.  This is from the oxygen used during your procedure.  There is no need for concern and it should clear up in a day or so.  SYMPTOMS TO REPORT IMMEDIATELY:   Following lower endoscopy (colonoscopy or flexible sigmoidoscopy):  Excessive amounts of blood in the stool  Significant tenderness or worsening of abdominal pains  Swelling of the abdomen that is new, acute  Fever of 100F or higher   Following upper endoscopy (EGD)  Vomiting of blood or coffee ground material  New chest pain or pain under the shoulder blades  Painful or persistently difficult swallowing  New shortness of breath  Fever of 100F or higher  Black, tarry-looking stools  For urgent or emergent issues, a gastroenterologist can be reached at any hour by calling [phone].   DIET:  We do recommend a small meal at first, but then you may proceed to your regular diet.  Drink  plenty of fluids but you should avoid alcoholic beverages for 24 hours.  ACTIVITY:  You should plan to take it easy for the rest of today and you should NOT DRIVE or use heavy machinery until tomorrow (because of the sedation medicines used during the test).    FOLLOW UP: Our staff will call the number listed on your records the next business day following your procedure to check on you and address any questions or concerns that you may have regarding the information given to you following your procedure. If we do not reach you, we will leave a message.  However, if you are feeling well and you are not experiencing any problems, there is no need to return our call.  We will assume that you have returned to your regular daily activities without incident.  If any biopsies were taken you will be contacted by phone or by letter within the next 1-3 weeks.  Please call us at [phone] if you have not heard about the biopsies in 3 weeks.   SIGNATURES/CONFIDENTIALITY: You and/or your care partner have signed paperwork which will be entered into your electronic medical record.  These signatures attest to the fact that that the information above on your After Visit Summary has been reviewed and is understood.  Full responsibility of the confidentiality of this discharge information lies with you and/or your care-partner.  Next colonoscopy- 10 years  Await pathology from upper endoscopy  Continue  your normal medications

## 2017-10-11 NOTE — Op Note (Signed)
[facility] Patient Name: [name] Procedure Date: 10/11/2017 1:13 PM MRN: [id number] Endoscopist: Sherilyn Cooter L. Myrtie Cook , MD Age: 52 Referring MD:  Date of Birth: 02/04/1966 Gender: Male Account #: [id number] Procedure:                Upper GI endoscopy Indications:              Gastro-esophageal reflux disease, Previously                            treated for Helicobacter pylori Medicines:                Monitored Anesthesia Care Procedure:                Pre-Anesthesia Assessment:                           - Prior to the procedure, a History and Physical                            was performed, and patient medications and                            allergies were reviewed. The patient's tolerance of                            previous anesthesia was also reviewed. The risks                            and benefits of the procedure and the sedation                            options and risks were discussed with the patient.                            All questions were answered, and informed consent                            was obtained. Prior Anticoagulants: The patient has                            taken no previous anticoagulant or antiplatelet                            agents. ASA Grade Assessment: II - A patient with                            mild systemic disease. After reviewing the risks                            and benefits, the patient was deemed in                            satisfactory condition to undergo the procedure.  After obtaining informed consent, the endoscope was                            passed under direct vision. Throughout the                            procedure, the patient's blood pressure, pulse, and                            oxygen saturations were monitored continuously. The                            Model GIF-HQ190 [id number](SN#[id number]) scope was introduced                            through the mouth, and  advanced to the second part                            of duodenum. The upper GI endoscopy was                            accomplished without difficulty. The patient                            tolerated the procedure well. Scope In: Scope Out: Findings:                 The esophagus was normal. Specifically, no                            Barrett's esophagus was seen.                           The stomach was normal. Biopsies were taken with a                            cold forceps for histology to confirm H. pylori                            edarication.                           The cardia and gastric fundus were normal on                            retroflexion.                           The examined duodenum was normal. Complications:            No immediate complications. Estimated Blood Loss:     Estimated blood loss: none. Impression:               - Normal esophagus.                           - Normal stomach. Biopsied.                           -  Normal examined duodenum. Recommendation:           - Patient has a contact number available for                            emergencies. The signs and symptoms of potential                            delayed complications were discussed with the                            patient. Return to normal activities tomorrow.                            Written discharge instructions were provided to the                            patient.                           - Resume previous diet.                           - Continue present medications.                           - See the other procedure note for documentation of                            additional recommendations. Anthony L. Myrtie Neither, MD 10/11/2017 1:47:19 PM This report has been signed electronically.

## 2017-10-11 NOTE — Progress Notes (Signed)
Called to room to assist during endoscopic procedure.  Patient ID and intended procedure confirmed with present staff. Received instructions for my participation in the procedure from the performing physician.  

## 2017-10-11 NOTE — Progress Notes (Signed)
A and O x3. Report to RN. Tolerated MAC anesthesia well.Teeth unchanged after procedure.

## 2017-10-11 NOTE — Progress Notes (Signed)
Pt's states no medical or surgical changes since previsit or office visit. 

## 2017-10-14 ENCOUNTER — Telehealth: Payer: Self-pay

## 2017-10-14 NOTE — Telephone Encounter (Signed)
  Follow up Call-  Call back number 10/11/2017  Post procedure Call Back phone  # [phone]  Permission to leave phone message Yes  Some recent data might be hidden     lmom [name]/Recovery Room Follow-up call

## 2017-10-14 NOTE — Telephone Encounter (Signed)
  Follow up Call-  Call back number 10/11/2017  Post procedure Call Back phone  # [phone]  Permission to leave phone message Yes  Some recent data might be hidden     No answer for the second time today Javelle Donigan/Recovery Room

## 2017-10-14 NOTE — Telephone Encounter (Signed)
Patient calling back states he is doing fine after procedure.  °

## 2017-10-15 ENCOUNTER — Encounter: Payer: Self-pay | Admitting: Gastroenterology

## 2018-03-07 ENCOUNTER — Ambulatory Visit: Payer: BLUE CROSS/BLUE SHIELD | Admitting: Family Medicine

## 2018-03-07 ENCOUNTER — Encounter: Payer: Self-pay | Admitting: Family Medicine

## 2018-03-07 VITALS — BP 118/77 | HR 60 | Temp 98.3°F | Resp 17 | Ht 69.5 in | Wt 188.0 lb

## 2018-03-07 DIAGNOSIS — F439 Reaction to severe stress, unspecified: Secondary | ICD-10-CM

## 2018-03-07 DIAGNOSIS — K649 Unspecified hemorrhoids: Secondary | ICD-10-CM | POA: Diagnosis not present

## 2018-03-07 MED ORDER — LORAZEPAM 0.5 MG PO TABS: 0.2500 mg | ORAL_TABLET | Freq: Two times a day (BID) | ORAL | 0 refills | Status: DC | PRN

## 2018-03-07 MED ORDER — HYDROCORTISONE 2.5 % RE CREA: 1.0000 "application " | TOPICAL_CREAM | Freq: Two times a day (BID) | RECTAL | 0 refills | Status: DC

## 2018-03-07 NOTE — Patient Instructions (Addendum)
For stress at work, see information below on stress and stress management. I would also recommend meeting with a therapist to discuss treatment of stress.  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 I did write for a few lorazepam for more stressful times, but that is for short term.  No more than 2 alcoholic drinks per day, and do not combine alcohol and the lorazepam. Recheck with me in 3-4 weeks to discuss your stress further.  Vivia Budge: [phone] Arvil Chaco: [phone]   See info on hemorrhoids below. anusol HC as needed.  As the size has improved the past few days, would not recommend cutting into that area at this time.Return to the clinic or go to the nearest emergency room if any of your symptoms worsen or new symptoms occur.   Hemorrhoids Hemorrhoids are swollen veins in and around the rectum or anus. There are two types of hemorrhoids:  Internal hemorrhoids. These occur in the veins that are just inside the rectum. They may poke through to the outside and become irritated and painful.  External hemorrhoids. These occur in the veins that are outside of the anus and can be felt as a painful swelling or hard lump near the anus.  Most hemorrhoids do not cause serious problems, and they can be managed with home treatments such as diet and lifestyle changes. If home treatments do not help your symptoms, procedures can be done to shrink or remove the hemorrhoids. What are the causes? This condition is caused by increased pressure in the anal area. This pressure may result from various things, including:  Constipation.  Straining to have a bowel movement.  Diarrhea.  Pregnancy.  Obesity.  Sitting for long periods of time.  Heavy lifting or other activity that causes you to strain.  Anal sex.  What are the signs or symptoms? Symptoms of this condition include:  Pain.  Anal  itching or irritation.  Rectal bleeding.  Leakage of stool (feces).  Anal swelling.  One or more lumps around the anus.  How is this diagnosed? This condition can often be diagnosed through a visual exam. Other exams or tests may also be done, such as:  Examination of the rectal area with a gloved hand (digital rectal exam).  Examination of the anal canal using a small tube (anoscope).  A blood test, if you have lost a significant amount of blood.  A test to look inside the colon (sigmoidoscopy or colonoscopy).  How is this treated? This condition can usually be treated at home. However, various procedures may be done if dietary changes, lifestyle changes, and other home treatments do not help your symptoms. These procedures can help make the hemorrhoids smaller or remove them completely. Some of these procedures involve surgery, and others do not. Common procedures include:  Rubber band ligation. Rubber bands are placed at the base of the hemorrhoids to cut off the blood supply to them.  Sclerotherapy. Medicine is injected into the hemorrhoids to shrink them.  Infrared coagulation. A type of light energy is used to get rid of the hemorrhoids.  Hemorrhoidectomy surgery. The hemorrhoids are surgically removed, and the veins that supply them are tied off.  Stapled hemorrhoidopexy surgery. A circular stapling device is used to remove the hemorrhoids and use staples to cut off the blood supply to them.  Follow these instructions at home: Eating and drinking  Eat foods that  have a lot of fiber in them, such as whole grains, beans, nuts, fruits, and vegetables. Ask your health care provider about taking products that have added fiber (fiber supplements).  Drink enough fluid to keep your urine clear or pale yellow. Managing pain and swelling  Take warm sitz baths for 20 minutes, 3-4 times a day to ease pain and discomfort.  If directed, apply ice to the affected area. Using ice  packs between sitz baths may be helpful. ? Put ice in a plastic bag. ? Place a towel between your skin and the bag. ? Leave the ice on for 20 minutes, 2-3 times a day. General instructions  Take over-the-counter and prescription medicines only as told by your health care provider.  Use medicated creams or suppositories as told.  Exercise regularly.  Go to the bathroom when you have the urge to have a bowel movement. Do not wait.  Avoid straining to have bowel movements.  Keep the anal area dry and clean. Use wet toilet paper or moist towelettes after a bowel movement.  Do not sit on the toilet for long periods of time. This increases blood pooling and pain. Contact a health care provider if:  You have increasing pain and swelling that are not controlled by treatment or medicine.  You have uncontrolled bleeding.  You have difficulty having a bowel movement, or you are unable to have a bowel movement.  You have pain or inflammation outside the area of the hemorrhoids. This information is not intended to replace advice given to you by your health care provider. Make sure you discuss any questions you have with your health care provider. Document Released: 09/07/2000 Document Revised: 02/08/2016 Document Reviewed: 05/25/2015 Elsevier Interactive Patient Education  2018 [place] and Stress Management Stress is a normal reaction to life events. It is what you feel when life demands more than you are used to or more than you can handle. Some stress can be useful. For example, the stress reaction can help you catch the last bus of the day, study for a test, or meet a deadline at work. But stress that occurs too often or for too long can cause problems. It can affect your emotional health and interfere with relationships and normal daily activities. Too much stress can weaken your immune system and increase your risk for physical illness. If you already have a medical problem,  stress can make it worse. What are the causes? All sorts of life events may cause stress. An event that causes stress for one person may not be stressful for another person. Major life events commonly cause stress. These may be positive or negative. Examples include losing your job, moving into a new home, getting married, having a baby, or losing a loved one. Less obvious life events may also cause stress, especially if they occur day after day or in combination. Examples include working long hours, driving in traffic, caring for children, being in debt, or being in a difficult relationship. What are the signs or symptoms? Stress may cause emotional symptoms including, the following:  Anxiety. This is feeling worried, afraid, on edge, overwhelmed, or out of control.  Anger. This is feeling irritated or impatient.  Depression. This is feeling sad, down, helpless, or guilty.  Difficulty focusing, remembering, or making decisions.  Stress may cause physical symptoms, including the following:  Aches and pains. These may affect your head, neck, back, stomach, or other areas of your body.  Tight muscles  or clenched jaw.  Low energy or trouble sleeping.  Stress may cause unhealthy behaviors, including the following:  Eating to feel better (overeating) or skipping meals.  Sleeping too little, too much, or both.  Working too much or putting off tasks (procrastination).  Smoking, drinking alcohol, or using drugs to feel better.  How is this diagnosed? Stress is diagnosed through an assessment by your health care provider. Your health care provider will ask questions about your symptoms and any stressful life events.Your health care provider will also ask about your medical history and may order blood tests or other tests. Certain medical conditions and medicine can cause physical symptoms similar to stress. Mental illness can cause emotional symptoms and unhealthy behaviors similar to  stress. Your health care provider may refer you to a mental health professional for further evaluation. How is this treated? Stress management is the recommended treatment for stress.The goals of stress management are reducing stressful life events and coping with stress in healthy ways. Techniques for reducing stressful life events include the following:  Stress identification. Self-monitor for stress and identify what causes stress for you. These skills may help you to avoid some stressful events.  Time management. Set your priorities, keep a calendar of events, and learn to say "no." These tools can help you avoid making too many commitments.  Techniques for coping with stress include the following:  Rethinking the problem. Try to think realistically about stressful events rather than ignoring them or overreacting. Try to find the positives in a stressful situation rather than focusing on the negatives.  Exercise. Physical exercise can release both physical and emotional tension. The key is to find a form of exercise you enjoy and do it regularly.  Relaxation techniques. These relax the body and mind. Examples include yoga, meditation, tai chi, biofeedback, deep breathing, progressive muscle relaxation, listening to music, being out in nature, journaling, and other hobbies. Again, the key is to find one or more that you enjoy and can do regularly.  Healthy lifestyle. Eat a balanced diet, get plenty of sleep, and do not smoke. Avoid using alcohol or drugs to relax.  Strong support network. Spend time with family, friends, or other people you enjoy being around.Express your feelings and talk things over with someone you trust.  Counseling or talktherapy with a mental health professional may be helpful if you are having difficulty managing stress on your own. Medicine is typically not recommended for the treatment of stress.Talk to your health care provider if you think you need medicine for  symptoms of stress. Follow these instructions at home:  Keep all follow-up visits as directed by your health care provider.  Take all medicines as directed by your health care provider. Contact a health care provider if:  Your symptoms get worse or you start having new symptoms.  You feel overwhelmed by your problems and can no longer manage them on your own. Get help right away if:  You feel like hurting yourself or someone else. This information is not intended to replace advice given to you by your health care provider. Make sure you discuss any questions you have with your health care provider. Document Released: 03/06/2001 Document Revised: 02/16/2016 Document Reviewed: 05/05/2013 Elsevier Interactive Patient Education  2017 [employer].   IF you received an x-ray today, you will receive an invoice from [facility]. Please contact [facility] at [phone] with questions or concerns regarding your invoice.   IF you received labwork today, you will receive  an Pharmacologist from [employer] [facility]. Please contact [place] at [phone] with questions or concerns regarding your invoice.   Our billing staff will not be able to assist you with questions regarding bills from these companies.  You will be contacted with the lab results as soon as they are available. The fastest way to get your results is to activate your My Chart account. Instructions are located on the last page of this paperwork. If you have not heard from [place] regarding the results in 2 weeks, please contact this office.

## 2018-03-07 NOTE — Progress Notes (Signed)
Subjective:  By signing my name below, I, Moises Blood, attest that this documentation has been prepared under the direction and in the presence of Merri Ray, MD. Electronically Signed: Moises Blood, [place]. 03/07/2018 , 9:59 AM .  Patient was seen in Room 1 .   Patient ID: [name], male    DOB: March 08, 1966, 52 y.o.   MRN: [id number] Chief Complaint  Patient presents with  . Hemorrhoids   HPI [name] is a 52 y.o. male Here for hemorrhoids. 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 He informed his supervisor that it was a stressful work environment, and was affecting his health with chest pain while under stress and problem with hemorrhoids; his supervisor told him to "deal with it." He reports previously suffering anxiety years ago, treated with Lorazepam by Dr. Joseph Art, taking 1 at night 2-3x times a week. He hasn't taken this medication in a long time. He notes Lorazepam helped took the edge off. He denies chest pain with activity or exertion outside stress. He hasn't talked to a counselor to discuss anxiety. He isn't sure if there's EAP through work. He drinks alcohol on the weekends at home, about half a bottle of brandy (about 6-7 drinks).   He noticed painful hemorrhoid swelling about 2 days ago. He's been applying preparation-H to the area with some improvement. He denies bleeding from the area yet. He denies taking medication for this. He denies straining or constipation. He denies heavy lifting. He denies change in diet recently. He mentions having one in January when he had colonoscopy done, but it wasn't bothering him at that time.   Patient Active Problem List   Diagnosis Date Noted  . Elevated cholesterol 09/07/2016  . LEG EDEMA, RIGHT 11/28/2010   Past Medical History:  Diagnosis Date  . Anxiety    Past Surgical History:  Procedure Laterality Date  . APPENDECTOMY    .  HERNIA REPAIR    . KNEE ARTHROSCOPY W/ MENISCAL REPAIR Left   . NERVE SURGERY    . RIH     No Active Allergies Prior to Admission medications   Medication Sig Start Date End Date Taking? Authorizing Provider  GLUCOSAMINE-CHONDROITIN PO Take 1 tablet by mouth daily.   Yes [provider]  Multiple Vitamins-Minerals (CENTRUM PO) Take 1 tablet by mouth daily.   Yes [provider]  Omega-3 Fatty Acids (OMEGA 3 PO) Take 1 tablet by mouth as needed.   Yes [provider]   Social History   Socioeconomic History  . Marital status: Single    Spouse name: Not on file  . Number of children: 0  . Years of education: Not on file  . Highest education level: Not on file  Occupational History  . Not on file  Social Needs  . Financial resource strain: Not on file  . Food insecurity:    Worry: Not on file    Inability: Not on file  . Transportation needs:    Medical: Not on file    Non-medical: Not on file  Tobacco Use  . Smoking status: Never Smoker  . Smokeless tobacco: Never Used  Substance and Sexual Activity  . Alcohol use: Yes    Comment: 1-2 per day  . Drug use: No  . Sexual activity: Never    Birth control/protection: Abstinence  Lifestyle  . Physical activity:    Days per week: Not on file  Minutes per session: Not on file  . Stress: Not on file  Relationships  . Social connections:    Talks on phone: Not on file    Gets together: Not on file    Attends religious service: Not on file    Active member of club or organization: Not on file    Attends meetings of clubs or organizations: Not on file    Relationship status: Not on file  . Intimate partner violence:    Fear of current or ex partner: Not on file    Emotionally abused: Not on file    Physically abused: Not on file    Forced sexual activity: Not on file  Other Topics Concern  . Not on file  Social History Narrative  . Not on file   Review of Systems  Constitutional: Negative  for fatigue and unexpected weight change.  Eyes: Negative for visual disturbance.  Respiratory: Negative for cough, chest tightness and shortness of breath.   Cardiovascular: Negative for chest pain, palpitations and leg swelling.  Gastrointestinal: Negative for abdominal pain and blood in stool.  Genitourinary: Positive for genital sores (hemorrhoid).  Neurological: Negative for dizziness, light-headedness and headaches.  Psychiatric/Behavioral: The patient is nervous/anxious.        Objective:   Physical Exam  Constitutional: He is oriented to person, place, and time. He appears well-developed and well-nourished. No distress.  HENT:  Head: Normocephalic and atraumatic.  Eyes: Pupils are equal, round, and reactive to light. EOM are normal.  Neck: Neck supple.  Cardiovascular: Normal rate.  Pulmonary/Chest: Effort normal. No respiratory distress.  Genitourinary:  Genitourinary Comments: Rectum: multiple hemorrhoids at the left side of his anus, approximately at 9 o'clock and 10 o'clock; at 9 o'clock there is a swollen hemorrhoid that is somewhat firm, but does not appear to be completely thrombus  Musculoskeletal: Normal range of motion.  Neurological: He is alert and oriented to person, place, and time.  Skin: Skin is warm and dry.  Psychiatric: He has a normal mood and affect. His behavior is normal.  Nursing note and vitals reviewed.   Vitals:   03/07/18 0919  BP: 118/77  Pulse: 60  Resp: 17  Temp: 98.3 F (36.8 C)  TempSrc: Oral  SpO2: 98%  Weight: 188 lb (85.3 kg)  Height: 5' 9.5" (1.765 m)       Assessment & Plan:    [name] is a 52 y.o. male Acute hemorrhoid  -improving. Does not appear to be thrombosed at this time. Anusol HC if needed, rtc precautions.   Situational stress - Plan: LORazepam (ATIVAN) 0.5 MG tablet  - handout given on stress. Ativan short term if needed, but also recommended meeting with therapist.  EAP through work may be initial  option. Decreased alcohol also discussed. Recheck in 3-4 weeks.   Meds ordered this encounter  Medications  . LORazepam (ATIVAN) 0.5 MG tablet    Sig: Take 0.5-1 tablets (0.25-0.5 mg total) by mouth 2 (two) times daily as needed for anxiety.    Dispense:  15 tablet    Refill:  0   Patient Instructions   For stress at work, see information below on stress and stress management. I would also recommend meeting with a therapist to discuss treatment of stress.  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 I did write for a few lorazepam for more stressful times, but that  is for short term.  No more than 2 alcoholic drinks per day, and do not combine alcohol and the lorazepam. Recheck with me in 3-4 weeks to discuss your stress further.  Vivia Budge: [phone] Arvil Chaco: [phone]   See info on hemorrhoids below. anusol HC as needed.  As the size has improved the past few days, would not recommend cutting into that area at this time.Return to the clinic or go to the nearest emergency room if any of your symptoms worsen or new symptoms occur.   Hemorrhoids Hemorrhoids are swollen veins in and around the rectum or anus. There are two types of hemorrhoids:  Internal hemorrhoids. These occur in the veins that are just inside the rectum. They may poke through to the outside and become irritated and painful.  External hemorrhoids. These occur in the veins that are outside of the anus and can be felt as a painful swelling or hard lump near the anus.  Most hemorrhoids do not cause serious problems, and they can be managed with home treatments such as diet and lifestyle changes. If home treatments do not help your symptoms, procedures can be done to shrink or remove the hemorrhoids. What are the causes? This condition is caused by increased pressure in the anal area. This pressure may result from various  things, including:  Constipation.  Straining to have a bowel movement.  Diarrhea.  Pregnancy.  Obesity.  Sitting for long periods of time.  Heavy lifting or other activity that causes you to strain.  Anal sex.  What are the signs or symptoms? Symptoms of this condition include:  Pain.  Anal itching or irritation.  Rectal bleeding.  Leakage of stool (feces).  Anal swelling.  One or more lumps around the anus.  How is this diagnosed? This condition can often be diagnosed through a visual exam. Other exams or tests may also be done, such as:  Examination of the rectal area with a gloved hand (digital rectal exam).  Examination of the anal canal using a small tube (anoscope).  A blood test, if you have lost a significant amount of blood.  A test to look inside the colon (sigmoidoscopy or colonoscopy).  How is this treated? This condition can usually be treated at home. However, various procedures may be done if dietary changes, lifestyle changes, and other home treatments do not help your symptoms. These procedures can help make the hemorrhoids smaller or remove them completely. Some of these procedures involve surgery, and others do not. Common procedures include:  Rubber band ligation. Rubber bands are placed at the base of the hemorrhoids to cut off the blood supply to them.  Sclerotherapy. Medicine is injected into the hemorrhoids to shrink them.  Infrared coagulation. A type of light energy is used to get rid of the hemorrhoids.  Hemorrhoidectomy surgery. The hemorrhoids are surgically removed, and the veins that supply them are tied off.  Stapled hemorrhoidopexy surgery. A circular stapling device is used to remove the hemorrhoids and use staples to cut off the blood supply to them.  Follow these instructions at home: Eating and drinking  Eat foods that have a lot of fiber in them, such as whole grains, beans, nuts, fruits, and vegetables. Ask your health  care provider about taking products that have added fiber (fiber supplements).  Drink enough fluid to keep your urine clear or pale yellow. Managing pain and swelling  Take warm sitz baths for 20 minutes, 3-4 times a day to ease pain and  discomfort.  If directed, apply ice to the affected area. Using ice packs between sitz baths may be helpful. ? Put ice in a plastic bag. ? Place a towel between your skin and the bag. ? Leave the ice on for 20 minutes, 2-3 times a day. General instructions  Take over-the-counter and prescription medicines only as told by your health care provider.  Use medicated creams or suppositories as told.  Exercise regularly.  Go to the bathroom when you have the urge to have a bowel movement. Do not wait.  Avoid straining to have bowel movements.  Keep the anal area dry and clean. Use wet toilet paper or moist towelettes after a bowel movement.  Do not sit on the toilet for long periods of time. This increases blood pooling and pain. Contact a health care provider if:  You have increasing pain and swelling that are not controlled by treatment or medicine.  You have uncontrolled bleeding.  You have difficulty having a bowel movement, or you are unable to have a bowel movement.  You have pain or inflammation outside the area of the hemorrhoids. This information is not intended to replace advice given to you by your health care provider. Make sure you discuss any questions you have with your health care provider. Document Released: 09/07/2000 Document Revised: 02/08/2016 Document Reviewed: 05/25/2015 Elsevier Interactive Patient Education  2018 [place] and Stress Management Stress is a normal reaction to life events. It is what you feel when life demands more than you are used to or more than you can handle. Some stress can be useful. For example, the stress reaction can help you catch the last bus of the day, study for a test, or meet a  deadline at work. But stress that occurs too often or for too long can cause problems. It can affect your emotional health and interfere with relationships and normal daily activities. Too much stress can weaken your immune system and increase your risk for physical illness. If you already have a medical problem, stress can make it worse. What are the causes? All sorts of life events may cause stress. An event that causes stress for one person may not be stressful for another person. Major life events commonly cause stress. These may be positive or negative. Examples include losing your job, moving into a new home, getting married, having a baby, or losing a loved one. Less obvious life events may also cause stress, especially if they occur day after day or in combination. Examples include working long hours, driving in traffic, caring for children, being in debt, or being in a difficult relationship. What are the signs or symptoms? Stress may cause emotional symptoms including, the following:  Anxiety. This is feeling worried, afraid, on edge, overwhelmed, or out of control.  Anger. This is feeling irritated or impatient.  Depression. This is feeling sad, down, helpless, or guilty.  Difficulty focusing, remembering, or making decisions.  Stress may cause physical symptoms, including the following:  Aches and pains. These may affect your head, neck, back, stomach, or other areas of your body.  Tight muscles or clenched jaw.  Low energy or trouble sleeping.  Stress may cause unhealthy behaviors, including the following:  Eating to feel better (overeating) or skipping meals.  Sleeping too little, too much, or both.  Working too much or putting off tasks (procrastination).  Smoking, drinking alcohol, or using drugs to feel better.  How is this diagnosed? Stress is diagnosed through  an assessment by your health care provider. Your health care provider will ask questions about your  symptoms and any stressful life events.Your health care provider will also ask about your medical history and may order blood tests or other tests. Certain medical conditions and medicine can cause physical symptoms similar to stress. Mental illness can cause emotional symptoms and unhealthy behaviors similar to stress. Your health care provider may refer you to a mental health professional for further evaluation. How is this treated? Stress management is the recommended treatment for stress.The goals of stress management are reducing stressful life events and coping with stress in healthy ways. Techniques for reducing stressful life events include the following:  Stress identification. Self-monitor for stress and identify what causes stress for you. These skills may help you to avoid some stressful events.  Time management. Set your priorities, keep a calendar of events, and learn to say "no." These tools can help you avoid making too many commitments.  Techniques for coping with stress include the following:  Rethinking the problem. Try to think realistically about stressful events rather than ignoring them or overreacting. Try to find the positives in a stressful situation rather than focusing on the negatives.  Exercise. Physical exercise can release both physical and emotional tension. The key is to find a form of exercise you enjoy and do it regularly.  Relaxation techniques. These relax the body and mind. Examples include yoga, meditation, tai chi, biofeedback, deep breathing, progressive muscle relaxation, listening to music, being out in nature, journaling, and other hobbies. Again, the key is to find one or more that you enjoy and can do regularly.  Healthy lifestyle. Eat a balanced diet, get plenty of sleep, and do not smoke. Avoid using alcohol or drugs to relax.  Strong support network. Spend time with family, friends, or other people you enjoy being around.Express your feelings  and talk things over with someone you trust.  Counseling or talktherapy with a mental health professional may be helpful if you are having difficulty managing stress on your own. Medicine is typically not recommended for the treatment of stress.Talk to your health care provider if you think you need medicine for symptoms of stress. Follow these instructions at home:  Keep all follow-up visits as directed by your health care provider.  Take all medicines as directed by your health care provider. Contact a health care provider if:  Your symptoms get worse or you start having new symptoms.  You feel overwhelmed by your problems and can no longer manage them on your own. Get help right away if:  You feel like hurting yourself or someone else. This information is not intended to replace advice given to you by your health care provider. Make sure you discuss any questions you have with your health care provider. Document Released: 03/06/2001 Document Revised: 02/16/2016 Document Reviewed: 05/05/2013 Elsevier Interactive Patient Education  2017 [employer].   IF you received an x-ray today, you will receive an invoice from [facility]. Please contact [facility] at [phone] with questions or concerns regarding your invoice.   IF you received labwork today, you will receive an invoice from [facility]. Please contact [facility] at [phone] with questions or concerns regarding your invoice.   Our billing staff will not be able to assist you with questions regarding bills from these companies.  You will be contacted with the lab results as soon as they are available. The fastest way to get your results is to activate your My Chart  account. Instructions are located on the last page of this paperwork. If you have not heard from [place] regarding the results in 2 weeks, please contact this office.       I personally performed the services described in this documentation, which  was scribed in my presence. The recorded information has been reviewed and considered for accuracy and completeness, addended by me as needed, and agree with information above.  Signed,   Merri Ray, MD [facility].  03/07/18 10:34 PM

## 2018-06-09 NOTE — Progress Notes (Signed)
Cardiology Office Note   Date:  06/16/2018   ID:  Anel Purohit, DOB 04/28/1966, MRN [id number]  PCP:  Shade Flood, MD  Cardiologist:   Charlton Haws, MD   No chief complaint on file.     History of Present Illness: [name] is a 52 y.o. male who presents for consultation regarding chest pain. Referred by Dr Neva Seat. Has a new supervisor at work and this has caused a lot of stress. He has history of anxiety and use to take BZ;s Chest pain is with stress not exertion or exercise. Has 1/2 bottle of brandy on weekends Also seen by primary for hemorrhoids. Had admission for atypical chest pain in 2012 R/O  He works [employer] Niece that he lived with just moved to [place] and has family in [place] and [place] Swims at times with no pains     Past Medical History:  Diagnosis Date  . Anxiety     Past Surgical History:  Procedure Laterality Date  . APPENDECTOMY    . HERNIA REPAIR    . KNEE ARTHROSCOPY W/ MENISCAL REPAIR Left   . NERVE SURGERY    . RIH       Current Outpatient Medications  Medication Sig Dispense Refill  . GLUCOSAMINE-CHONDROITIN PO Take 1 tablet by mouth daily.    . hydrocortisone (ANUSOL-HC) 2.5 % rectal cream Place 1 application rectally 2 (two) times daily. 30 g 0  . LORazepam (ATIVAN) 0.5 MG tablet Take 0.5-1 tablets (0.25-0.5 mg total) by mouth 2 (two) times daily as needed for anxiety. 15 tablet 0  . Multiple Vitamins-Minerals (CENTRUM PO) Take 1 tablet by mouth daily.    . Omega-3 Fatty Acids (OMEGA 3 PO) Take 1 tablet by mouth as needed.     Current Facility-Administered Medications  Medication Dose Route Frequency Provider Last Rate Last Dose  . 0.9 %  sodium chloride infusion  500 mL Intravenous Once Sherrilyn Rist, MD        Allergies:   Patient has no active allergies.    Social History:  The patient  reports that he has never smoked. He has never used smokeless tobacco. He reports that he drinks alcohol. He reports  that he does not use drugs.   Family History:  The patient's family history includes Alcohol abuse in his brother; Alzheimer's disease (age of onset: 72) in his father; Heart Problems in his brother; Heart disease in his sister; Other in his brother; Ovarian cancer in his mother; Thyroid cancer in his sister.    ROS:  Please see the history of present illness.   Otherwise, review of systems are positive for none.   All other systems are reviewed and negative.    PHYSICAL EXAM: VS:  BP 116/88   Pulse 64   Ht 5' 9.5" (1.765 m)   Wt 189 lb 4 oz (85.8 kg)   SpO2 99%   BMI 27.55 kg/m  , BMI Body mass index is 27.55 kg/m. Affect appropriate Healthy:  appears stated age HEENT: normal Neck supple with no adenopathy JVP normal no bruits no thyromegaly Lungs clear with no wheezing and good diaphragmatic motion Heart:  S1/S2 no murmur, no rub, gallop or click PMI normal Abdomen: benighn, BS positve, no tenderness, no AAA no bruit.  No HSM or HJR Distal pulses intact with no bruits No edema Neuro non-focal Skin warm and dry No muscular weakness    EKG:  04/22/12 SR limb lead reversal normal  06/16/18  SR rate 56 normal    Recent Labs: 06/28/2017: ALT 37; BUN 19; Creatinine, Ser 1.11; Potassium 4.1; Sodium 141    Lipid Panel    Component Value Date/Time   CHOL 200 (H) 06/28/2017 1019   TRIG 188 (H) 06/28/2017 1019   HDL 39 (L) 06/28/2017 1019   CHOLHDL 5.1 (H) 06/28/2017 1019   CHOLHDL 5.3 (H) 12/27/2015 0925   VLDL 60 (H) 12/27/2015 0925   LDLCALC 123 (H) 06/28/2017 1019      Wt Readings from Last 3 Encounters:  06/16/18 189 lb 4 oz (85.8 kg)  03/07/18 188 lb (85.3 kg)  10/11/17 191 lb (86.6 kg)      Other studies Reviewed: Additional studies/ records that were reviewed today include: Notes from primary labs ECG [facility] admission for chest pain 2012 .    ASSESSMENT AND PLAN:  1.  Chest pain:  Atypical normal ECG f/u ET 2. Anxiety:  Some better apparently his  supervisor at work was spoken to  3. Hemoroid : f/u primary Anusol  4. HLD:  Diet Rx unless found to have vascular disease f/u primary    Current medicines are reviewed at length with the patient today.  The patient does not have concerns regarding medicines.  The following changes have been made:  no change  Labs/ tests ordered today include: ETT/ Calcium score  No orders of the defined types were placed in this encounter.    Disposition:   FU with cardiology PRN      Signed, Charlton HawsPeter Trasean Delima, MD  06/16/2018 9:24 AM    [facility] [address], [place], [place]  [address] Phone: [phone]; Fax: [phone]

## 2018-06-16 ENCOUNTER — Encounter: Payer: Self-pay | Admitting: Cardiovascular Disease

## 2018-06-16 ENCOUNTER — Ambulatory Visit: Payer: BLUE CROSS/BLUE SHIELD | Admitting: Cardiovascular Disease

## 2018-06-16 VITALS — BP 116/88 | HR 64 | Ht 69.5 in | Wt 189.2 lb

## 2018-06-16 DIAGNOSIS — R079 Chest pain, unspecified: Secondary | ICD-10-CM

## 2018-06-16 NOTE — Patient Instructions (Signed)
Medication Instructions:  Your physician recommends that you continue on your current medications as directed. Please refer to the Current Medication list given to you today.  Labwork: NONE  Testing/Procedures: Your physician has requested that you have an exercise tolerance test. For further information please visit [email or web address]. Please also follow instruction sheet, as given.  Follow-Up: Your physician wants you to follow-up as needed with Dr. Nishan.    If you need a refill on your cardiac medications before your next appointment, please call your pharmacy.    

## 2018-06-18 NOTE — Addendum Note (Signed)
Addended by: Weber Cooks on: 06/18/2018 09:00 AM   Modules accepted: Orders

## 2018-06-26 ENCOUNTER — Ambulatory Visit (INDEPENDENT_AMBULATORY_CARE_PROVIDER_SITE_OTHER): Payer: BLUE CROSS/BLUE SHIELD

## 2018-06-26 DIAGNOSIS — R079 Chest pain, unspecified: Secondary | ICD-10-CM | POA: Diagnosis not present

## 2018-06-26 LAB — EXERCISE TOLERANCE TEST
CHL CUP MPHR: 168 {beats}/min
CSEPPHR: 146 {beats}/min
Estimated workload: 13.4 METS
Exercise duration (min): 12 min
Exercise duration (sec): 0 s
Percent HR: 86 %
RPE: 17
Rest HR: 60 {beats}/min

## 2018-06-27 ENCOUNTER — Telehealth: Payer: Self-pay

## 2018-06-27 NOTE — Telephone Encounter (Signed)
Informed patient that his Exercise Tolerance Test is normal.

## 2018-06-27 NOTE — Telephone Encounter (Signed)
Patient returning call for results 

## 2018-06-27 NOTE — Telephone Encounter (Signed)
-----   Message from Wendall Stade, MD sent at 06/26/2018  5:44 PM EDT ----- Normal exercise tolerance test

## 2018-06-27 NOTE — Telephone Encounter (Signed)
Left message for pt to call back re: echo

## 2018-07-03 ENCOUNTER — Telehealth: Payer: Self-pay | Admitting: Cardiovascular Disease

## 2018-07-03 NOTE — Telephone Encounter (Signed)
Left message for patient to call back. Informed patient on message that he does not have to call back if he has already gotten his results.

## 2018-07-03 NOTE — Telephone Encounter (Signed)
New Message:     Patient is calling for ETT results

## 2018-10-24 IMAGING — DX DG ELBOW COMPLETE 3+V*R*
4 series · 4 of 4 positions shown · non-contrast
Comparison: None.

CLINICAL DATA: Right elbow swelling for 2 weeks.

EXAM:
RIGHT ELBOW - COMPLETE 3+ VIEW

[elbow ap]
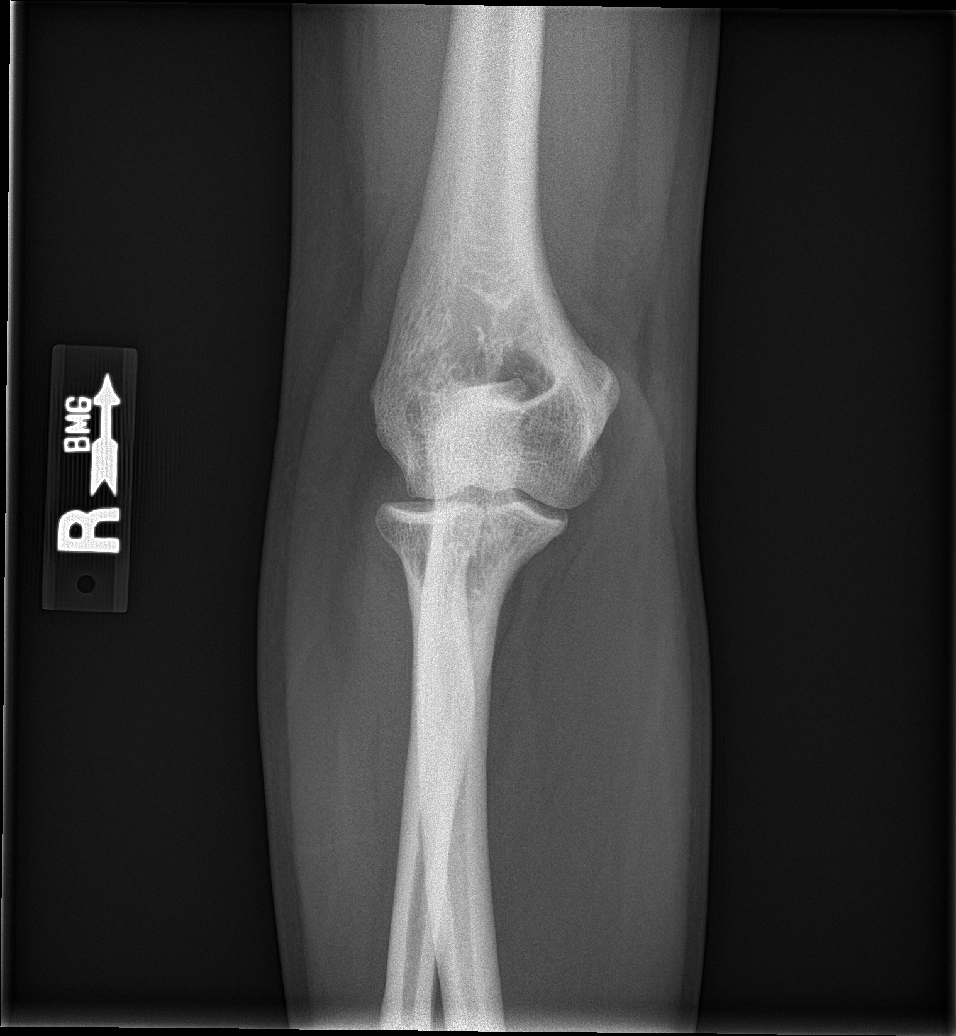

[elbow obl (1 of 2)]
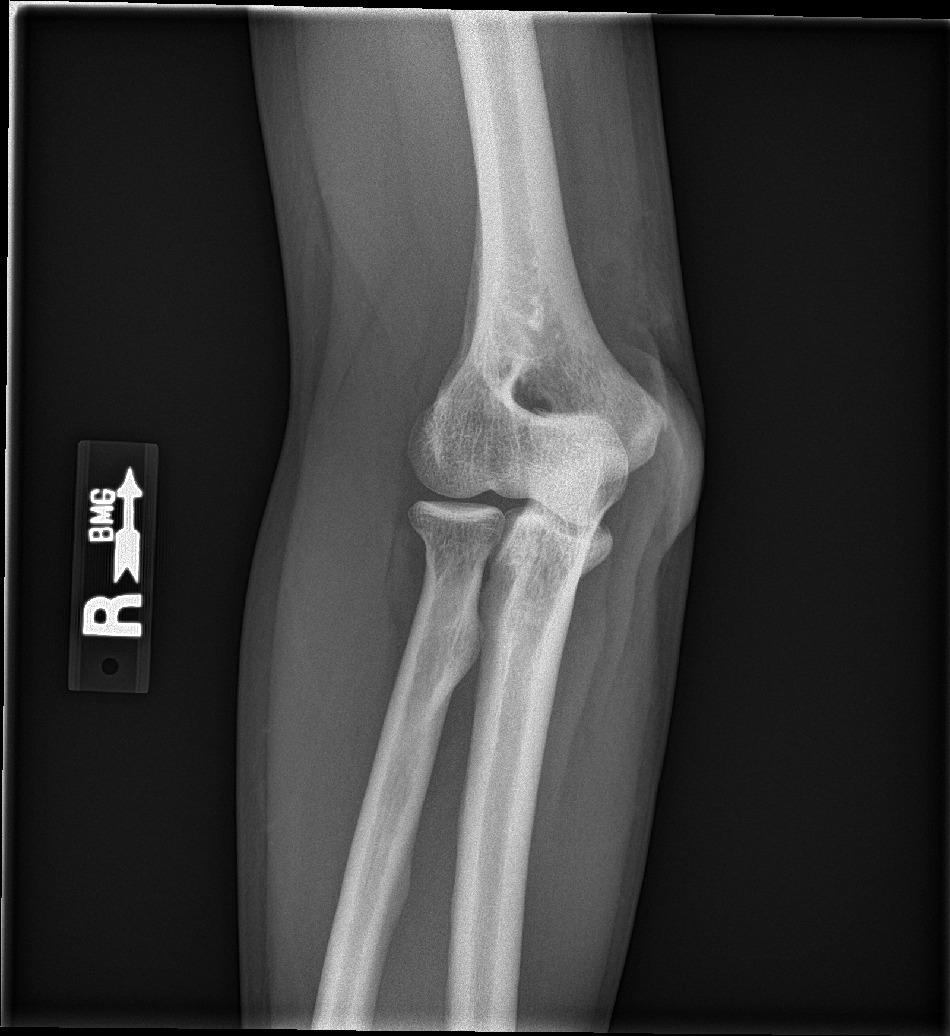

[elbow obl (2 of 2)]
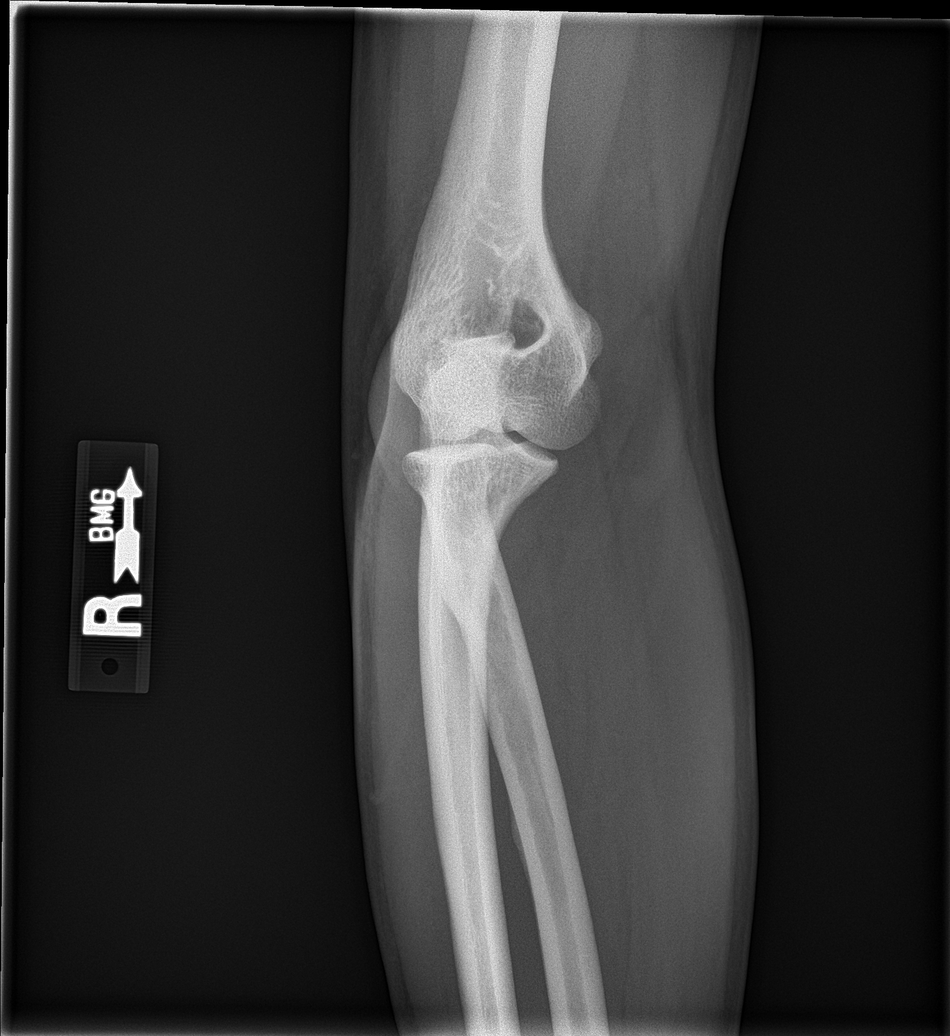

[elbow lat]
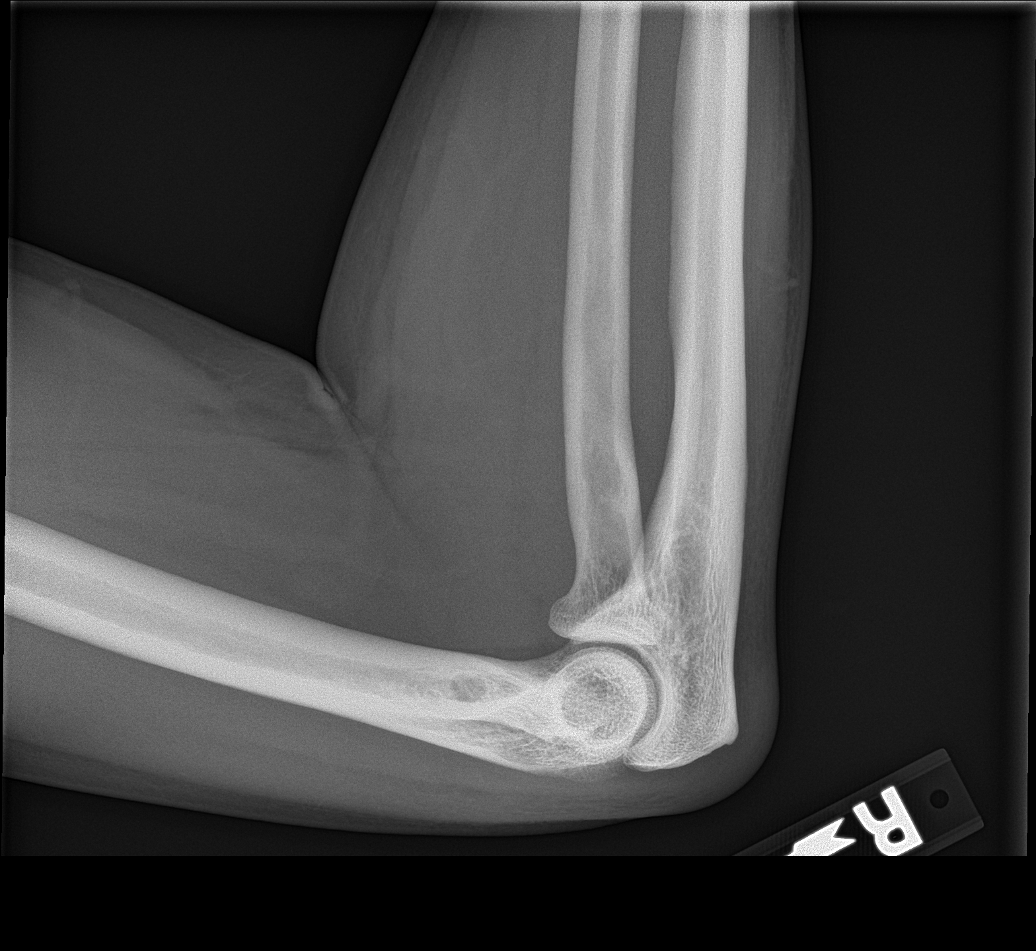

[4 of 4 positions shown; findings below may reference images not displayed]

FINDINGS: There is no evidence of fracture, dislocation, or joint effusion.
There is no evidence of arthropathy or other focal bone abnormality.
Soft tissues are unremarkable.
IMPRESSION: No acute osseous injury of the right elbow.

## 2019-03-26 ENCOUNTER — Ambulatory Visit: Payer: BLUE CROSS/BLUE SHIELD | Admitting: Family Medicine

## 2019-05-26 ENCOUNTER — Ambulatory Visit: Payer: BC Managed Care – PPO | Admitting: Emergency Medicine

## 2019-05-26 ENCOUNTER — Encounter: Payer: Self-pay | Admitting: Emergency Medicine

## 2019-05-26 ENCOUNTER — Other Ambulatory Visit: Payer: Self-pay

## 2019-05-26 VITALS — BP 112/73 | HR 67 | Temp 98.8°F | Resp 16 | Ht 68.0 in | Wt 186.0 lb

## 2019-05-26 DIAGNOSIS — K6289 Other specified diseases of anus and rectum: Secondary | ICD-10-CM | POA: Diagnosis not present

## 2019-05-26 DIAGNOSIS — K644 Residual hemorrhoidal skin tags: Secondary | ICD-10-CM

## 2019-05-26 MED ORDER — HYDROCORTISONE (PERIANAL) 2.5 % EX CREA: 1.0000 "application " | TOPICAL_CREAM | Freq: Two times a day (BID) | CUTANEOUS | 3 refills | Status: DC

## 2019-05-26 MED ORDER — IBUPROFEN 600 MG PO TABS: 600.0000 mg | ORAL_TABLET | Freq: Three times a day (TID) | ORAL | 0 refills | Status: DC | PRN

## 2019-05-26 MED ORDER — TRAMADOL HCL 50 MG PO TABS: 50.0000 mg | ORAL_TABLET | Freq: Three times a day (TID) | ORAL | 0 refills | Status: AC | PRN

## 2019-05-26 NOTE — Patient Instructions (Addendum)
If you have lab work done today you will be contacted with your lab results within the next 2 weeks.  If you have not heard from [place] then please contact us. The fastest way to get your results is to register for My Chart.   IF you received an x-ray today, you will receive an invoice from [facility]. Please contact [facility] at [phone] with questions or concerns regarding your invoice.   IF you received labwork today, you will receive an invoice from [facility]. Please contact [facility] at [phone] with questions or concerns regarding your invoice.   Our billing staff will not be able to assist you with questions regarding bills from these companies.  You will be contacted with the lab results as soon as they are available. The fastest way to get your results is to activate your My Chart account. Instructions are located on the last page of this paperwork. If you have not heard from [place] regarding the results in 2 weeks, please contact this office.    Printing AVS Hemorroides Hemorrhoids Las hemorroides son venas inflamadas que pueden desarrollarse:  En el ano (recto). Estas se denominan hemorroides internas.  Alrededor de la abertura del ano. Estas se denominan hemorroides externas. Las hemorroides pueden causar dolor, picazn o hemorragias. Generalmente no causan problemas graves. Con frecuencia mejoran al [employer] dieta, el estilo de vida y otros tratamientos Financial planner. Cules son las causas? Esta afeccin puede ser causada por lo siguiente:  Tener dificultad para defecar (estreimiento).  Hacer mucha fuerza (esfuerzo) para defecar.  Materia fecal lquida (diarrea).  Embarazo.  Tener mucho sobrepeso (obesidad).  Estar sentado durante largos perodos de [place].  Levantar objetos pesados u otras actividades que impliquen esfuerzo.  Sexo anal.  Andar en bicicleta por un largo perodo de tiempo. Cules son los signos o los sntomas? Los  sntomas de esta afeccin incluyen los siguientes:  Social research officer, government.  Picazn o irritacin en el ano.  Sangrado proveniente del ano.  Prdida de materia fecal.  Inflamacin en la zona.  Uno o ms bultos alrededor de la abertura del ano. Cmo se diagnostica? A menudo un mdico puede diagnosticar esta afeccin al observar la zona afectada. El mdico tambin puede:  Optometrist un examen que implica palpar la zona con la mano enguantada (examen rectal digital).  Examinar el interior de la zona anal utilizando un pequeo tubo (anoscopio).  Pedir anlisis de [place]. Es posible que esto se realice si ha perdido Optometrist.  Solicitarle un estudio que consiste en la observacin del interior del colon utilizando un tubo flexible con una cmara en el extremo (sigmoidoscopia o colonoscopa). Cmo se trata? Esta afeccin generalmente se puede tratar en el hogar. El mdico puede indicarle que cambie de Designer, multimedia, de estilo de vida o que trate de Physicist, medical. Si esto no da resultado, se pueden realizar procedimientos para extirpar las hemorroides o reducir Publishing rights manager. Estos pueden implicar lo siguiente:  Building services engineer en la base de las hemorroides para interrumpir la irrigacin de Herbalist.  Inyectar un medicamento en las hemorroides para reducir Publishing rights manager.  Dirigir un tipo de Teacher, early years/pre de luz hacia las hemorroides para Field seismologist que se caigan.  Realizar Ardelia Mems ciruga para extirpar las hemorroides o cortar la irrigacin de [place]. Siga estas indicaciones en su casa: Comida y bebida   Consuma alimentos con alto contenido de [place]. Entre ellos cereales integrales, frijoles, frutos secos, frutas y verduras.  Pregntele a su  mdico acerca de tomar productos con fibra aadida en ellos (complementos defibra).  Disminuya la cantidad de grasa de la dieta. Para esto, puede hacer lo siguiente: ? Coma productos lcteos descremados. ? Coma menos carne roja. ? No consuma  alimentos procesados.  Beba suficiente lquido para Photographermantener la orina de color amarillo plido. Control del dolor y la hinchazn   Tome un bao de agua tibia (bao de asiento) durante 20 minutos para Engineer, materialsaliviar el dolor. Hgalo 3 o 4veces al da. Puede hacer esto en una baera o usar un dispositivo porttil para bao de asiento que se coloca sobre el inodoro.  Si se lo indican, aplique hielo sobre la zona dolorida. Puede ser beneficioso aplicar hielo The Krogerentre los baos con agua tibia. ? Ponga el hielo en una bolsa plstica. ? Coloque una First[employer] Corptoalla entre la piel y Copyla bolsa. ? Coloque el hielo durante 20minutos, 2 a 3veces por da. Indicaciones generales  Baxter Internationalome los medicamentos de venta libre y los recetados solamente como se lo haya indicado el mdico. ? Las Health Netcremas medicinales y los medicamentos pueden usarse como se lo hayan indicado.  Haga ejercicio fsico con frecuencia. Consulte al mdico qu tipos de ejercicios son mejores para usted y qu cantidad.  Vaya al bao cuando sienta ganas de defecar. No espere.  Evite hacer demasiada fuerza al defecar.  Mantenga el ano seco y limpio. Use papel higinico hmedo o toallitas humedecidas despus de defecar.  No pase mucho tiempo sentado en el inodoro.  Concurra a todas las visitas de [address]seguimiento como se lo haya indicado el mdico. Esto es importante. Comunquese con un mdico si:  Tiene dolor e hinchazn que no mejoran con el tratamiento o los medicamentos.  Tiene problemas para defecar.  No puede defecar.  Tiene dolor o hinchazn en la zona exterior de las hemorroides. Solicite ayuda inmediatamente si tiene:  Hemorragia que no se detiene. Resumen  Las hemorroides son venas hinchadas en el ano o la zona que rodea el ano.  Pueden causar dolor, picazn o sangrado.  Consuma alimentos con alto contenido de [place]fibra. Entre ellos cereales integrales, frijoles, frutos secos, frutas y verduras.  Tome un bao de agua tibia (bao de asiento)  durante 20 minutos para Engineer, materialsaliviar el dolor. Hgalo 3 o 4veces al da. Esta informacin no tiene Theme park managercomo fin reemplazar el consejo del mdico. Asegrese de hacerle al mdico cualquier pregunta que tenga. Document Released: 01/05/2013 Document Revised: 03/20/2018 Document Reviewed: 03/20/2018 Elsevier Patient Education  2020 [employer]

## 2019-05-26 NOTE — Progress Notes (Signed)
[name] 53 y.o.   Chief Complaint  Patient presents with   Hemorrhoids    pain since Friday    HISTORY OF PRESENT ILLNESS: This is a 53 y.o. male complaining of painful hemorrhoid that started 4 days ago.  Has a history of hemorrhoids with similar clinical presentations.  Denies rectal bleeding.  Denies fever or chills.  Able to eat and drink.  Denies nausea or vomiting.  Denies any other significant symptoms.  HPI   Prior to Admission medications   Medication Sig Start Date End Date Taking? Authorizing Provider  GLUCOSAMINE-CHONDROITIN PO Take 1 tablet by mouth daily.   Yes [provider]  Astrid Drafts 500 MG CAPS Take by mouth daily.   Yes [provider]  Omega-3 Fatty Acids (OMEGA 3 PO) Take 1 tablet by mouth as needed.   Yes [provider]  hydrocortisone (ANUSOL-HC) 2.5 % rectal cream Place 1 application rectally 2 (two) times daily. Patient not taking: Reported on 05/26/2019 03/07/18   Wendie Agreste, MD  LORazepam (ATIVAN) 0.5 MG tablet Take 0.5-1 tablets (0.25-0.5 mg total) by mouth 2 (two) times daily as needed for anxiety. Patient not taking: Reported on 05/26/2019 03/07/18   Wendie Agreste, MD  Multiple Vitamins-Minerals (CENTRUM PO) Take 1 tablet by mouth daily.    [provider]    No Active Allergies  Patient Active Problem List   Diagnosis Date Noted   Elevated cholesterol 09/07/2016    Past Medical History:  Diagnosis Date   Anxiety     Past Surgical History:  Procedure Laterality Date   APPENDECTOMY     HERNIA REPAIR     KNEE ARTHROSCOPY W/ MENISCAL REPAIR Left    NERVE SURGERY     RIH      Social History   Socioeconomic History   Marital status: Single    Spouse name: Not on file   Number of children: 0   Years of education: Not on file   Highest education level: Not on file  Occupational History   Not on file  Social Needs   Financial resource strain: Not on file   Food  insecurity    Worry: Not on file    Inability: Not on file   Transportation needs    Medical: Not on file    Non-medical: Not on file  Tobacco Use   Smoking status: Never Smoker   Smokeless tobacco: Never Used  Substance and Sexual Activity   Alcohol use: Yes    Comment: 1-2 per day   Drug use: No   Sexual activity: Never    Birth control/protection: Abstinence  Lifestyle   Physical activity    Days per week: Not on file    Minutes per session: Not on file   Stress: Not on file  Relationships   Social connections    Talks on phone: Not on file    Gets together: Not on file    Attends religious service: Not on file    Active member of club or organization: Not on file    Attends meetings of clubs or organizations: Not on file    Relationship status: Not on file   Intimate partner violence    Fear of current or ex partner: Not on file    Emotionally abused: Not on file    Physically abused: Not on file    Forced sexual activity: Not on file  Other Topics Concern   Not on file  Social History  Narrative   Not on file    Family History  Problem Relation Age of Onset   Ovarian cancer Mother    Alzheimer's disease Father 2   Heart disease Sister    Other Brother        Malaria   Heart Problems Brother    Alcohol abuse Brother    Thyroid cancer Sister      Review of Systems  Constitutional: Negative.  Negative for chills and fever.  HENT: Negative.  Negative for congestion and sore throat.   Respiratory: Negative.  Negative for cough and shortness of breath.   Cardiovascular: Negative.  Negative for chest pain and palpitations.  Gastrointestinal: Negative.  Negative for abdominal pain, blood in stool, diarrhea, nausea and vomiting.  Genitourinary: Negative for dysuria and hematuria.  Musculoskeletal: Negative for joint pain and myalgias.  Neurological: Negative for dizziness and headaches.  All other systems reviewed and are  negative.   Today's Vitals   05/26/19 1103  BP: 112/73  Pulse: 67  Resp: 16  Temp: 98.8 F (37.1 C)  TempSrc: Oral  SpO2: 99%  Weight: 186 lb (84.4 kg)  Height: 5\' 8"  (1.727 m)   Body mass index is 28.28 kg/m.  Physical Exam Vitals signs reviewed.  Constitutional:      Appearance: Normal appearance.  HENT:     Head: Normocephalic.  Neck:     Musculoskeletal: Normal range of motion.  Cardiovascular:     Rate and Rhythm: Normal rate and regular rhythm.  Pulmonary:     Effort: Pulmonary effort is normal.  Abdominal:     Palpations: Abdomen is soft.     Tenderness: There is no abdominal tenderness.  Genitourinary:    Rectum: External hemorrhoid (Tender, nonthrombosed, inflamed tissue) present. No anal fissure.  Musculoskeletal: Normal range of motion.  Neurological:     General: No focal deficit present.     Mental Status: He is alert and oriented to person, place, and time.  Psychiatric:        Mood and Affect: Mood normal.        Behavior: Behavior normal.      ASSESSMENT & PLAN: Aundray was seen today for hemorrhoids.  Diagnoses and all orders for this visit:  External hemorrhoid -     hydrocortisone (ANUSOL-HC) 2.5 % rectal cream; Place 1 application rectally 2 (two) times daily.  Rectal pain -     traMADol (ULTRAM) 50 MG tablet; Take 1 tablet (50 mg total) by mouth every 8 (eight) hours as needed for up to 5 days. -     ibuprofen (ADVIL) 600 MG tablet; Take 1 tablet (600 mg total) by mouth every 8 (eight) hours as needed.    Patient Instructions       If you have lab work done today you will be contacted with your lab results within the next 2 weeks.  If you have not heard from [place] then please contact us. The fastest way to get your results is to register for My Chart.   IF you received an x-ray today, you will receive an invoice from [facility]. Please contact [facility] at [phone] with questions or concerns regarding your  invoice.   IF you received labwork today, you will receive an invoice from [place]. Please contact [facility] at [phone] with questions or concerns regarding your invoice.   Our billing staff will not be able to assist you with questions regarding bills from these companies.  You will be contacted with the  lab results as soon as they are available. The fastest way to get your results is to activate your My Chart account. Instructions are located on the last page of this paperwork. If you have not heard from us regarding the results in 2 weeks, please contact this office.    Printing AVS Hemorroides Hemorrhoids Las hemorroides son venas inflamadas que pueden desarrollarse:  En el ano (recto). Estas se denominan hemorroides internas.  Alrededor de la abertura del ano. Estas se denominan hemorroides externas. Las hemorroides pueden causar dolor, picazn o hemorragias. Generalmente no causan problemas graves. Con frecuencia mejoran al Applied Materialscambiar la dieta, el estilo de vida y otros tratamientos Facilities manageren el hogar. Cules son las causas? Esta afeccin puede ser causada por lo siguiente:  Tener dificultad para defecar (estreimiento).  Hacer mucha fuerza (esfuerzo) para defecar.  Materia fecal lquida (diarrea).  Embarazo.  Tener mucho sobrepeso (obesidad).  Estar sentado durante largos perodos de [place].  Levantar objetos pesados u otras actividades que impliquen esfuerzo.  Sexo anal.  Andar en bicicleta por un largo perodo de tiempo. Cules son los signos o los sntomas? Los sntomas de esta afeccin incluyen los siguientes:  Engineer, miningDolor.  Picazn o irritacin en el ano.  Sangrado proveniente del ano.  Prdida de materia fecal.  Inflamacin en la zona.  Uno o ms bultos alrededor de la abertura del ano. Cmo se diagnostica? A menudo un mdico puede diagnosticar esta afeccin al observar la zona afectada. El mdico tambin puede:  Education officer, environmentalealizar un examen que implica palpar la  zona con la mano enguantada (examen rectal digital).  Examinar el interior de la zona anal utilizando un pequeo tubo (anoscopio).  Pedir anlisis de [place]. Es posible que esto se realice si ha perdido Warden/rangermucha sangre.  Solicitarle un estudio que consiste en la observacin del interior del colon utilizando un tubo flexible con una cmara en el extremo (sigmoidoscopia o colonoscopa). Cmo se trata? Esta afeccin generalmente se puede tratar en el hogar. El mdico puede indicarle que cambie de Paediatric nursealimentacin, de estilo de vida o que trate de Psychologist, sport and exerciserealizar tratamientos en el hogar. Si esto no da resultado, se pueden realizar procedimientos para extirpar las hemorroides o reducir Brewing technologistsu tamao. Estos pueden implicar lo siguiente:  Museum/gallery conservatorColocar bandas elsticas en la base de las hemorroides para interrumpir la irrigacin de Risk managerla sangre.  Inyectar un medicamento en las hemorroides para reducir Brewing technologistsu tamao.  Dirigir un tipo de Engineer, drillingenerga de luz hacia las hemorroides para Radio producerhacer que se caigan.  Realizar Neomia Dearuna ciruga para extirpar las hemorroides o cortar la irrigacin de [place] Lakesangre. Siga estas indicaciones en su casa: Comida y bebida   Consuma alimentos con alto contenido de Sectionfibra. Entre ellos cereales integrales, frijoles, frutos secos, frutas y verduras.  Pregntele a su mdico acerca de tomar productos con fibra aadida en ellos (complementos defibra).  Disminuya la cantidad de grasa de la dieta. Para esto, puede hacer lo siguiente: ? Coma productos lcteos descremados. ? Coma menos carne roja. ? No consuma alimentos procesados.  Beba suficiente lquido para Photographermantener la orina de color amarillo plido. Control del dolor y la hinchazn   Tome un bao de agua tibia (bao de asiento) durante 20 minutos para Engineer, materialsaliviar el dolor. Hgalo 3 o 4veces al da. Puede hacer esto en una baera o usar un dispositivo porttil para bao de asiento que se coloca sobre el inodoro.  Si se lo indican, aplique hielo sobre la zona  dolorida. Puede ser beneficioso aplicar hielo The Krogerentre los baos con agua tibia. ?  Ponga el hielo en una bolsa plstica. ? Coloque una First[employer] Corptoalla entre la piel y Copyla bolsa. ? Coloque el hielo durante 20minutos, 2 a 3veces por da. Indicaciones generales  Baxter Internationalome los medicamentos de venta libre y los recetados solamente como se lo haya indicado el mdico. ? Las Health Netcremas medicinales y los medicamentos pueden usarse como se lo hayan indicado.  Haga ejercicio fsico con frecuencia. Consulte al mdico qu tipos de ejercicios son mejores para usted y qu cantidad.  Vaya al bao cuando sienta ganas de defecar. No espere.  Evite hacer demasiada fuerza al defecar.  Mantenga el ano seco y limpio. Use papel higinico hmedo o toallitas humedecidas despus de defecar.  No pase mucho tiempo sentado en el inodoro.  Concurra a todas las visitas de [address]seguimiento como se lo haya indicado el mdico. Esto es importante. Comunquese con un mdico si:  Tiene dolor e hinchazn que no mejoran con el tratamiento o los medicamentos.  Tiene problemas para defecar.  No puede defecar.  Tiene dolor o hinchazn en la zona exterior de las hemorroides. Solicite ayuda inmediatamente si tiene:  Hemorragia que no se detiene. Resumen  Las hemorroides son venas hinchadas en el ano o la zona que rodea el ano.  Pueden causar dolor, picazn o sangrado.  Consuma alimentos con alto contenido de [place]fib[place]. Entre ellos cereales integrales, frijoles, frutos secos, frutas y verduras.  Tome un bao de agua tibia (bao de asiento) durante 20 minutos para Engineer, materialsaliviar el dolor. Hgalo 3 o 4veces al da. Esta informacin no tiene Theme park managercomo fin reemplazar el consejo del mdico. Asegrese de hacerle al mdico cualquier pregunta que tenga. Document Released: 01/05/2013 Document Revised: 03/20/2018 Document Reviewed: 03/20/2018 Elsevier Patient Education  2020 Elsevier Inc.      Edwina BarthMiguel Canuto Kingston, MD [facility]

## 2019-05-27 ENCOUNTER — Telehealth: Payer: Self-pay | Admitting: Emergency Medicine

## 2019-05-27 NOTE — Telephone Encounter (Signed)
Patient would like a referral to Dr. Loletha Carrow / gastro  His issue is not getting any better

## 2019-05-28 ENCOUNTER — Encounter: Payer: Self-pay | Admitting: Emergency Medicine

## 2019-05-28 ENCOUNTER — Telehealth (INDEPENDENT_AMBULATORY_CARE_PROVIDER_SITE_OTHER): Payer: BC Managed Care – PPO | Admitting: Emergency Medicine

## 2019-05-28 ENCOUNTER — Other Ambulatory Visit: Payer: Self-pay

## 2019-05-28 VITALS — Ht 68.0 in | Wt 184.0 lb

## 2019-05-28 DIAGNOSIS — K644 Residual hemorrhoidal skin tags: Secondary | ICD-10-CM | POA: Diagnosis not present

## 2019-05-28 NOTE — Telephone Encounter (Signed)
Pt states 30 minutes after this message-all the blood came out. He felt a lot better. He made a telemed today to talk with you.

## 2019-05-28 NOTE — Progress Notes (Signed)
Telemedicine Encounter- SOAP NOTE Established Patient  This telephone encounter was conducted with the patient's (or proxy's) verbal consent via audio telecommunications: yes/no: Yes Patient was instructed to have this encounter in a suitably private space; and to only have persons present to whom they give permission to participate. In addition, patient identity was confirmed by use of name plus two identifiers (DOB and address).  I discussed the limitations, risks, security and privacy concerns of performing an evaluation and management service by telephone and the availability of in person appointments. I also discussed with the patient that there may be a patient responsible charge related to this service. The patient expressed understanding and agreed to proceed.  I spent a total of TIME; 0 MIN TO 60 MIN: 10 minutes talking with the patient or their proxy.  No chief complaint on file. Painful hemorrhoids  Subjective   Anthony GuarneriOscar Cook is a 53 y.o. male established patient. Telephone visit today for follow-up of hemorrhoids.  Seen by me 2 days ago and started on medication for tender hemorrhoids.  States pain increased yesterday but then "hemorrhoid ruptured "and he felt better.  Noticed bleeding.  No purulent discharge.  Better today.  Has seen GI doctor, Dr. Maurine Ministerennis, in the past and would like a referral to him.  No new symptomatology.  HPI   Patient Active Problem List   Diagnosis Date Noted  . Elevated cholesterol 09/07/2016    Past Medical History:  Diagnosis Date  . Anxiety     Current Outpatient Medications  Medication Sig Dispense Refill  . GLUCOSAMINE-CHONDROITIN PO Take 1 tablet by mouth daily.    . hydrocortisone (ANUSOL-HC) 2.5 % rectal cream Place 1 application rectally 2 (two) times daily. 30 g 3  . ibuprofen (ADVIL) 600 MG tablet Take 1 tablet (600 mg total) by mouth every 8 (eight) hours as needed. 30 tablet 0  . Krill Oil 500 MG CAPS Take by mouth daily.    .  Omega-3 Fatty Acids (OMEGA 3 PO) Take 1 tablet by mouth as needed.    . traMADol (ULTRAM) 50 MG tablet Take 1 tablet (50 mg total) by mouth every 8 (eight) hours as needed for up to 5 days. 15 tablet 0  . LORazepam (ATIVAN) 0.5 MG tablet Take 0.5-1 tablets (0.25-0.5 mg total) by mouth 2 (two) times daily as needed for anxiety. (Patient not taking: Reported on 05/28/2019) 15 tablet 0  . Multiple Vitamins-Minerals (CENTRUM PO) Take 1 tablet by mouth daily.     Current Facility-Administered Medications  Medication Dose Route Frequency Provider Last Rate Last Dose  . 0.9 %  sodium chloride infusion  500 mL Intravenous Once Sherrilyn Ristanis, Henry L III, MD        No Known Allergies  Social History   Socioeconomic History  . Marital status: Single    Spouse name: Not on file  . Number of children: 0  . Years of education: Not on file  . Highest education level: Not on file  Occupational History  . Not on file  Social Needs  . Financial resource strain: Not on file  . Food insecurity    Worry: Not on file    Inability: Not on file  . Transportation needs    Medical: Not on file    Non-medical: Not on file  Tobacco Use  . Smoking status: Never Smoker  . Smokeless tobacco: Never Used  Substance and Sexual Activity  . Alcohol use: Yes    Comment: 1-2 per  day  . Drug use: No  . Sexual activity: Never    Birth control/protection: Abstinence  Lifestyle  . Physical activity    Days per week: Not on file    Minutes per session: Not on file  . Stress: Not on file  Relationships  . Social Herbalist on phone: Not on file    Gets together: Not on file    Attends religious service: Not on file    Active member of club or organization: Not on file    Attends meetings of clubs or organizations: Not on file    Relationship status: Not on file  . Intimate partner violence    Fear of current or ex partner: Not on file    Emotionally abused: Not on file    Physically abused: Not on file     Forced sexual activity: Not on file  Other Topics Concern  . Not on file  Social History Narrative  . Not on file    Review of Systems  Constitutional: Negative.  Negative for chills and fever.  HENT: Negative for congestion and sore throat.   Respiratory: Negative for cough and shortness of breath.   Cardiovascular: Negative for chest pain and palpitations.  Gastrointestinal: Negative for abdominal pain, diarrhea, nausea and vomiting.  Skin: Negative.  Negative for rash.  Neurological: Negative for dizziness and headaches.  All other systems reviewed and are negative.   Objective  Alert and oriented x3 in no apparent respiratory distress. Vitals as reported by the patient: Today's Vitals   05/28/19 1725  Weight: 184 lb (83.5 kg)  Height: 5\' 8"  (1.727 m)    There are no diagnoses linked to this encounter.  Diagnoses and all orders for this visit:  External hemorrhoid -     Ambulatory referral to Gastroenterology  Clinically stable.  No medical concerns identified during this visit. Follow-up with GI doctor as needed.   I discussed the assessment and treatment plan with the patient. The patient was provided an opportunity to ask questions and all were answered. The patient agreed with the plan and demonstrated an understanding of the instructions.   The patient was advised to call back or seek an in-person evaluation if the symptoms worsen or if the condition fails to improve as anticipated.  I provided 10 minutes of non-face-to-face time during this encounter.  Horald Pollen, MD  Primary Care at [facility]

## 2019-05-28 NOTE — Progress Notes (Signed)
Called patient to triage. Patient states he wants a referral to [facility] to see Dr Wilfrid Lund for his hemorrhoids.

## 2019-06-02 ENCOUNTER — Ambulatory Visit
Admission: EM | Admit: 2019-06-02 | Discharge: 2019-06-02 | Disposition: A | Discharge status: Home / Self Care | Payer: BC Managed Care – PPO | Attending: Emergency Medicine | Admitting: Emergency Medicine

## 2019-06-02 DIAGNOSIS — M7918 Myalgia, other site: Secondary | ICD-10-CM | POA: Diagnosis not present

## 2019-06-02 MED ORDER — CYCLOBENZAPRINE HCL 5 MG PO TABS: 5.0000 mg | ORAL_TABLET | Freq: Two times a day (BID) | ORAL | 0 refills | Status: DC | PRN

## 2019-06-02 NOTE — Discharge Instructions (Signed)
Take muscle relaxer as needed for severe pain, spasm. °May ice, rest, elevate the area(s) of pain.  You may also use hot compresses/warm wash rags to relieve muscle tightness. °May use OTC Tylenol, ibuprofen as needed for pain. °Return if you develop worsening pain, chest pain, difficulty breathing. °
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
## 2019-07-01 ENCOUNTER — Ambulatory Visit: Payer: BC Managed Care – PPO | Admitting: Family Medicine

## 2019-07-07 ENCOUNTER — Ambulatory Visit (INDEPENDENT_AMBULATORY_CARE_PROVIDER_SITE_OTHER): Payer: BC Managed Care – PPO | Admitting: Gastroenterology

## 2019-07-07 ENCOUNTER — Telehealth: Payer: Self-pay | Admitting: General Surgery

## 2019-07-07 ENCOUNTER — Encounter: Payer: Self-pay | Admitting: Gastroenterology

## 2019-07-07 ENCOUNTER — Other Ambulatory Visit: Payer: Self-pay

## 2019-07-07 VITALS — BP 110/78 | HR 76 | Temp 98.3°F | Ht 67.75 in | Wt 184.1 lb

## 2019-07-07 DIAGNOSIS — K6289 Other specified diseases of anus and rectum: Secondary | ICD-10-CM

## 2019-07-07 DIAGNOSIS — K602 Anal fissure, unspecified: Secondary | ICD-10-CM | POA: Diagnosis not present

## 2019-07-07 MED ORDER — CIPROFLOXACIN HCL 500 MG PO TABS: 500.0000 mg | ORAL_TABLET | Freq: Two times a day (BID) | ORAL | 0 refills | Status: DC

## 2019-07-07 MED ORDER — METRONIDAZOLE 500 MG PO TABS: 500.0000 mg | ORAL_TABLET | Freq: Three times a day (TID) | ORAL | 0 refills | Status: AC

## 2019-07-07 NOTE — Patient Instructions (Signed)
If you are age 53 or older, your body mass index should be between 23-30. Your Body mass index is 28.2 kg/m. If this is out of the aforementioned range listed, please consider follow up with your Primary Care Provider.  If you are age 50 or younger, your body mass index should be between 19-25. Your Body mass index is 28.2 kg/m. If this is out of the aformentioned range listed, please consider follow up with your Primary Care Provider.   Please purchase over the counter RectiCare. Use as directed.  You have been referred to [facility].  Make certain to bring a list of current medications, including any over the counter medications or vitamins. Also bring your co-pay if you have one as well as your insurance cards. [facility] is located at [address], [address]. Should you need to reschedule your appointment, please contact them at [phone].  It was a pleasure to see you today!  Dr. Loletha Carrow

## 2019-07-07 NOTE — Progress Notes (Signed)
[facility] Progress Note  Chief Complaint: Anal pain and bleeding  Subjective  History: Screening colonoscopy with me January 2019 only notable for hypertrophied anal papilla.  [name] sees me for 4 to 6 weeks of anal pain and bleeding.  He believes it may be hemorrhoids or related to the finding on his last colonoscopy. He began having anal pain with "protrusion" and swelling.  He feels what he believes may be a hemorrhoid on the outside that is tender.  He was out of work last month for about a week due to the symptoms, and says it finally "burst" with bleeding.  There is been intermittent bleeding since then, most recently last week. He denies fever chills night sweats, change in bowel habits or weight loss.  ROS: Cardiovascular:  no chest pain Respiratory: no dyspnea Anxiety Remainder of systems negative except as above The patient's Past Medical, Family and Social History were reviewed and are on file in the EMR.  Objective:  Med list reviewed  Current Outpatient Medications:  .  GLUCOSAMINE-CHONDROITIN PO, Take 1 tablet by mouth daily., Disp: , Rfl:  .  hydrocortisone (ANUSOL-HC) 2.5 % rectal cream, Place 1 application rectally 2 (two) times daily., Disp: 30 g, Rfl: 3 .  Krill Oil 500 MG CAPS, Take by mouth daily., Disp: , Rfl:  .  LORazepam (ATIVAN) 0.5 MG tablet, Take 0.5-1 tablets (0.25-0.5 mg total) by mouth 2 (two) times daily as needed for anxiety., Disp: 15 tablet, Rfl: 0 .  Multiple Vitamins-Minerals (CENTRUM PO), Take 1 tablet by mouth daily., Disp: , Rfl:  .  Omega-3 Fatty Acids (OMEGA 3 PO), Take 1 tablet by mouth as needed., Disp: , Rfl:  .  ciprofloxacin (CIPRO) 500 MG tablet, Take 1 tablet (500 mg total) by mouth 2 (two) times daily., Disp: 20 tablet, Rfl: 0 .  ibuprofen (ADVIL) 600 MG tablet, Take 1 tablet (600 mg total) by mouth every 8 (eight) hours as needed. (Patient not taking: Reported on 07/07/2019), Disp: 30 tablet, Rfl: 0 .  metroNIDAZOLE  (FLAGYL) 500 MG tablet, Take 1 tablet (500 mg total) by mouth 3 (three) times daily for 10 days., Disp: 30 tablet, Rfl: 0  Current Facility-Administered Medications:  .  0.9 %  sodium chloride infusion, 500 mL, Intravenous, Once, Danis, [place]arr [place] III, MD   Vital signs in last 24 hrs: Vitals:   07/07/19 0818  BP: 110/78  Pulse: 76  Temp: 98.3 F (36.8 C)    Physical Exam Exam chaperoned by Latricia Heft, CMA  HEENT: sclera anicteric, oral mucosa moist without lesions  Neck: supple, no thyromegaly, JVD or lymphadenopathy  Cardiac: RRR without murmurs, S1S2 heard, no peripheral edema  Pulm: clear to auscultation bilaterally, normal RR and effort noted  Abdomen: soft, no tenderness, with active bowel sounds. No guarding or palpable hepatosplenomegaly.  Skin; warm and dry, no jaundice or rash Rectal: Elongated posterior skin tag, probable sentinel pile.  He has a posterior tender anal fissure, and a scant amount of yellow-white pus can be expressed from it.  Remainder of DRE reveals the palpable hypertrophied anal papilla seen on last colonoscopy, otherwise normal. No perianal fluctuance or tenderness.   @ASSESSMENTPLANBEGIN @ Assessment: Encounter Diagnoses  Name Primary?  Anal fissure Yes  . Anal pain    It appears he developed an anal fissure and a sentinel pile.  He also seems to have low-grade infection. He was hoping the skin tag was the entire source of symptoms and could just be  removed.  Plan: Ciprofloxacin 500 mg twice daily and metronidazole 500 mg 3 times daily for 10 days RectiCare ointment as needed Referral to colorectal surgery.   Total time 25 minutes, over half spent face-to-face with patient in counseling and coordination of care.   Nelida Meuse III

## 2019-07-07 NOTE — Telephone Encounter (Signed)
Sent urgent referral to [facility] for Dr Dema Severin or Dr Marcello Moores. Will await appt information

## 2019-07-08 NOTE — Telephone Encounter (Signed)
Patient is scheduled with Dr Dema Severin on 07-27-2019 @ 11:30am.

## 2019-10-06 ENCOUNTER — Other Ambulatory Visit (HOSPITAL_COMMUNITY): Payer: BC Managed Care – PPO

## 2019-10-12 ENCOUNTER — Other Ambulatory Visit (HOSPITAL_COMMUNITY): Payer: BC Managed Care – PPO

## 2019-10-20 ENCOUNTER — Other Ambulatory Visit (HOSPITAL_COMMUNITY)
Admission: RE | Admit: 2019-10-20 | Discharge: 2019-10-20 | Disposition: A | Discharge status: Home / Self Care | Payer: BC Managed Care – PPO | Source: Ambulatory Visit | Attending: Surgery | Admitting: Surgery

## 2019-10-20 ENCOUNTER — Other Ambulatory Visit: Payer: Self-pay

## 2019-10-20 ENCOUNTER — Encounter (HOSPITAL_BASED_OUTPATIENT_CLINIC_OR_DEPARTMENT_OTHER): Payer: Self-pay | Admitting: Surgery

## 2019-10-20 ENCOUNTER — Ambulatory Visit: Payer: Self-pay | Admitting: Surgery

## 2019-10-20 DIAGNOSIS — Z20822 Contact with and (suspected) exposure to covid-19: Secondary | ICD-10-CM | POA: Insufficient documentation

## 2019-10-20 DIAGNOSIS — Z01812 Encounter for preprocedural laboratory examination: Secondary | ICD-10-CM | POA: Insufficient documentation

## 2019-10-20 LAB — SARS CORONAVIRUS 2 (TAT 6-24 HRS): SARS Coronavirus 2: NEGATIVE

## 2019-10-20 NOTE — H&P (Signed)
CC: Referred by Dr. Myrtie Cook for anal pain  HPI: [name] is a very pleasant 53yoM with hx of perianal pain that he first noticed 05/26/2019 - severe and knifelike with bowel movements. He also hasn't drained that time. He reported some tissue prolapse as well on one side. He denies any fever/chills. His symptoms have steadily improved but have not resolved. He has previously seen Dr. Myrtie Cook and underwent colonoscopy 09/2017 at which time he was found to have a hypertrophic papilla. He is not currently taking a fiber supplement. He has tried hydrocortisone cream with little improvement. He drinks 2-3 glasses of water per day. He spends 5-10 minutes on the commode. He reports having 1 soft, occasionally hard bowel movement per day. He has had intermittent drainage after experiencing some swelling in the perianal area and it seems to cyclically recur every 1-2 months.  He also reports having a tag of skin and the anal area is bothersome.  PMH: Perianal pain, inguinal hernia  PSH: Appendectomy, hernia surgery  FHx: Denies FHx of malignancy  Social: Denies use of tobacco/drugs; occasional EtOH use; works at [employer]  ROS: A comprehensive 10 system review of systems was completed with the patient and pertinent findings as noted above.  The patient is a 54 year old male.   Past Surgical History [name], CMA; 07/27/2019 11:10 AM) Appendectomy  Knee Surgery  Bilateral. Open Inguinal Hernia Surgery  Right.  Allergies [name], CMA; 07/27/2019 11:11 AM) No Known Drug Allergies [07/27/2019]: Allergies Reconciled   Medication History [name], CMA; 07/27/2019 11:11 AM) No Current Medications Medications Reconciled  Social History [name], CMA; 07/27/2019 11:10 AM) Alcohol use  Moderate alcohol use. Caffeine use  Coffee. Tobacco use  Never smoker.  Family History [name], [place]; 07/27/2019 11:10 AM) Ovarian Cancer  Mother. Thyroid problems  Sister.  Other  Problems [name], CMA; 07/27/2019 11:10 AM) Hemorrhoids  Inguinal Hernia     Review of Systems [name] CMA; 07/27/2019 11:10 AM) General Not Present- Appetite Loss, Chills, Fatigue, Fever, Night Sweats, Weight Gain and Weight Loss. Skin Not Present- Change in Wart/Mole, Dryness, Hives, Jaundice, New Lesions, Non-Healing Wounds, Rash and Ulcer. Respiratory Not Present- Bloody sputum, Chronic Cough, Difficulty Breathing, Snoring and Wheezing. Cardiovascular Present- Leg Cramps. Not Present- Chest Pain, Difficulty Breathing Lying Down, Palpitations, Rapid Heart Rate, Shortness of Breath and Swelling of Extremities. Gastrointestinal Present- Hemorrhoids. Not Present- Abdominal Pain, Bloating, Bloody Stool, Change in Bowel Habits, Chronic diarrhea, Constipation, Difficulty Swallowing, Excessive gas, Gets full quickly at meals, Indigestion, Nausea, Rectal Pain and Vomiting. Male Genitourinary Not Present- Blood in Urine, Change in Urinary Stream, Frequency, Impotence, Nocturia, Painful Urination, Urgency and Urine Leakage. Musculoskeletal Not Present- Back Pain, Joint Pain, Joint Stiffness, Muscle Pain, Muscle Weakness and Swelling of Extremities. Neurological Not Present- Decreased Memory, Fainting, Headaches, Numbness, Seizures, Tingling, Tremor, Trouble walking and Weakness. Endocrine Not Present- Cold Intolerance, Excessive Hunger, Hair Changes, Heat Intolerance, Hot flashes and New Diabetes. Hematology Not Present- Blood Thinners, Easy Bruising, Excessive bleeding, Gland problems, HIV and Persistent Infections.  Vitals [name] CMA; 07/27/2019 11:12 AM) 07/27/2019 11:11 AM Weight: 187.8 lb Height: 67in Body Surface Area: 1.97 m Body Mass Index: 29.41 kg/m  Temp.: 97.5F  Pulse: 66 (Regular)  BP: 110/70 (Sitting, Left Arm, Standard)       Physical Exam Anthony Deer M. Ledon Weihe MD; 07/27/2019 11:56 AM) The physical exam findings are as follows: Note:Constitutional:  No acute distress; conversant; no deformities; wearing surgical mask Eyes: Moist conjunctiva; no  lid lag; anicteric sclerae; pupils equal round and reactive to light Neck: Trachea midline; no palpable thyromegaly Lungs: Normal respiratory effort; no tactile fremitus CV: RRR; no palpable thrill; no pitting edema GI: Abdomen soft, nontender, nondistended; no palpable hepatosplenomegaly Anorectal: Perianal skin tag; posterior midline scar consistent with healed/healing anal fissure; posterior midline at anal opening likely external opening to fistula associated with healed fissure MSK: Normal gait; no clubbing/cyanosis Psychiatric: Appropriate affect; A&O 3 Lymphatic: No palpable cervical or axillary lymphadenopathy **A chaperone, [employer], was present for this encounter    Assessment & Plan Anthony Gave M. 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 is a very pleasant 57yoM with a likely history of anal fissure that has been healing/healed. 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 The pathophysiology of anal fissures and fistulas was discussed at length with associated pictures and illustrations using the ASCRS handouts -We discussed starting a fiber supplement, drinking 64 oz of water per day and limiting time on commode to 2-3 minutes -Somewhat limited exam due to discomfort but likely has underlying anal fissure and now a associated fistula. We'll also prescribe topical nifedipine to be applied to perianal skin -We discussed options moving forward including further observation which may result in persistence of the symptoms. He is interested in undergoing procedures to address this. We discussed anorectal examining anesthesia, possible fistulotomy, possible seton based upon intraoperative findings and what  these may entail, as well as removal of anal skin tag at his request. -The planned procedures, material risks (including, but not limited to, pain, bleeding, infection, scarring, need for blood transfusion, damage to anal sphincter, incontinence of gas and/or stool, need for additional procedures, recurrence, pneumonia, heart attack, stroke, death) benefits and alternatives to surgery were discussed at length. I noted a good probability that the procedure would help improve his symptoms. The patient's questions were answered to his satisfaction, he voiced understanding and elected to proceed with surgery. Additionally, we discussed typical postoperative expectations and the recovery process. -He has requested to delay this procedure until January as he works at [place] season is picking up.  Signed by Ileana Roup, MD (07/27/2019 12:01 PM)

## 2019-10-20 NOTE — Progress Notes (Signed)
Spoke w/ via phone for pre-op interview---[name] needs dos----   none           Cook results------ COVID test ------10-20-2019 Arrive at -------530 am NPO after ------midnight Medications to take morning of surgery -----none Diabetic medication -----n/a Patient Special Instructions -----follow all bowel prep instructions from dr white: clear liquids 500 pm day before surgery, 2 ounces milk of magnesia at 500 pm, fleets enema hs and am of surgery Pre-Op special Istructions ----- Patient verbalized understanding of instructions that were given at this phone interview. Patient denies shortness of breath, chest pain, fever, cough a this phone interview.

## 2019-10-22 NOTE — Anesthesia Preprocedure Evaluation (Addendum)
Anesthesia Evaluation  Patient identified by MRN, date of birth, ID band Patient awake    Reviewed: Allergy & Precautions, NPO status , Patient's Chart, lab work & pertinent test results  Airway Mallampati: II  TM Distance: >3 FB Neck ROM: Full    Dental  (+) Teeth Intact, Dental Advisory Given   Pulmonary neg pulmonary ROS,    Pulmonary exam normal breath sounds clear to auscultation       Cardiovascular Normal cardiovascular exam Rhythm:Regular Rate:Normal  HLD   Neuro/Psych PSYCHIATRIC DISORDERS Anxiety negative neurological ROS     GI/Hepatic negative GI ROS, Neg liver ROS,   Endo/Other  negative endocrine ROS  Renal/GU negative Renal ROS     Musculoskeletal negative musculoskeletal ROS (+)   Abdominal   Peds  Hematology negative hematology ROS (+)   Anesthesia Other Findings Day of surgery medications reviewed with the patient.  Reproductive/Obstetrics                            Anesthesia Physical Anesthesia Plan  ASA: II  Anesthesia Plan: General   Post-op Pain Management:    Induction: Intravenous  PONV Risk Score and Plan: 2 and Midazolam, Dexamethasone and Ondansetron  Airway Management Planned: Oral ETT  Additional Equipment:   Intra-op Plan:   Post-operative Plan: Extubation in OR  Informed Consent: I have reviewed the patients History and Physical, chart, labs and discussed the procedure including the risks, benefits and alternatives for the proposed anesthesia with the patient or authorized representative who has indicated his/her understanding and acceptance.     Dental advisory given  Plan Discussed with: CRNA  Anesthesia Plan Comments:        Anesthesia Quick Evaluation

## 2019-10-23 ENCOUNTER — Encounter (HOSPITAL_BASED_OUTPATIENT_CLINIC_OR_DEPARTMENT_OTHER)
Admission: RE | Disposition: A | Discharge status: Home / Self Care | Payer: Self-pay | Source: Home / Self Care | Attending: Surgery

## 2019-10-23 ENCOUNTER — Ambulatory Visit (HOSPITAL_BASED_OUTPATIENT_CLINIC_OR_DEPARTMENT_OTHER): Payer: BC Managed Care – PPO | Admitting: Anesthesiology

## 2019-10-23 ENCOUNTER — Encounter (HOSPITAL_BASED_OUTPATIENT_CLINIC_OR_DEPARTMENT_OTHER): Payer: Self-pay | Admitting: Surgery

## 2019-10-23 ENCOUNTER — Ambulatory Visit (HOSPITAL_COMMUNITY)
Admission: RE | Admit: 2019-10-23 | Discharge: 2019-10-23 | Disposition: A | Discharge status: Home / Self Care | Payer: BC Managed Care – PPO | Attending: Surgery | Admitting: Surgery

## 2019-10-23 DIAGNOSIS — F419 Anxiety disorder, unspecified: Secondary | ICD-10-CM | POA: Diagnosis not present

## 2019-10-23 DIAGNOSIS — K644 Residual hemorrhoidal skin tags: Secondary | ICD-10-CM | POA: Diagnosis present

## 2019-10-23 DIAGNOSIS — K620 Anal polyp: Secondary | ICD-10-CM | POA: Diagnosis not present

## 2019-10-23 DIAGNOSIS — Z811 Family history of alcohol abuse and dependence: Secondary | ICD-10-CM | POA: Diagnosis not present

## 2019-10-23 DIAGNOSIS — Z8489 Family history of other specified conditions: Secondary | ICD-10-CM | POA: Insufficient documentation

## 2019-10-23 DIAGNOSIS — K603 Anal fistula: Secondary | ICD-10-CM | POA: Insufficient documentation

## 2019-10-23 DIAGNOSIS — Z8249 Family history of ischemic heart disease and other diseases of the circulatory system: Secondary | ICD-10-CM | POA: Insufficient documentation

## 2019-10-23 DIAGNOSIS — L918 Other hypertrophic disorders of the skin: Secondary | ICD-10-CM | POA: Insufficient documentation

## 2019-10-23 DIAGNOSIS — Z808 Family history of malignant neoplasm of other organs or systems: Secondary | ICD-10-CM | POA: Diagnosis not present

## 2019-10-23 DIAGNOSIS — Z8041 Family history of malignant neoplasm of ovary: Secondary | ICD-10-CM | POA: Insufficient documentation

## 2019-10-23 HISTORY — PX: ANAL FISTULOTOMY: SHX6423

## 2019-10-23 LAB — CBC WITH DIFFERENTIAL/PLATELET
Abs Immature Granulocytes: 0.02 10*3/uL (ref 0.00–0.07)
Basophils Absolute: 0 10*3/uL (ref 0.0–0.1)
Basophils Relative: 1 %
Eosinophils Absolute: 0.2 10*3/uL (ref 0.0–0.5)
Eosinophils Relative: 3 %
HCT: 52.8 % — ABNORMAL HIGH (ref 39.0–52.0)
Hemoglobin: 18.2 g/dL — ABNORMAL HIGH (ref 13.0–17.0)
Immature Granulocytes: 0 %
Lymphocytes Relative: 33 %
Lymphs Abs: 2.1 10*3/uL (ref 0.7–4.0)
MCH: 33.2 pg (ref 26.0–34.0)
MCHC: 34.5 g/dL (ref 30.0–36.0)
MCV: 96.2 fL (ref 80.0–100.0)
Monocytes Absolute: 0.4 10*3/uL (ref 0.1–1.0)
Monocytes Relative: 7 %
Neutro Abs: 3.5 10*3/uL (ref 1.7–7.7)
Neutrophils Relative %: 56 %
Platelets: 225 10*3/uL (ref 150–400)
RBC: 5.49 MIL/uL (ref 4.22–5.81)
RDW: 12 % (ref 11.5–15.5)
WBC: 6.3 10*3/uL (ref 4.0–10.5)
nRBC: 0 % (ref 0.0–0.2)

## 2019-10-23 LAB — APTT: aPTT: 28 seconds (ref 24–36)

## 2019-10-23 LAB — COMPREHENSIVE METABOLIC PANEL
ALT: 36 U/L (ref 0–44)
AST: 41 U/L (ref 15–41)
Albumin: 4.6 g/dL (ref 3.5–5.0)
Alkaline Phosphatase: 72 U/L (ref 38–126)
Anion gap: 10 (ref 5–15)
BUN: 15 mg/dL (ref 6–20)
CO2: 27 mmol/L (ref 22–32)
Calcium: 9.4 mg/dL (ref 8.9–10.3)
Chloride: 102 mmol/L (ref 98–111)
Creatinine, Ser: 1.12 mg/dL (ref 0.61–1.24)
GFR calc Af Amer: >60 mL/min (ref >60)
GFR calc non Af Amer: >60 mL/min (ref >60)
Glucose, Bld: 92 mg/dL (ref 70–99)
Potassium: 4.3 mmol/L (ref 3.5–5.1)
Sodium: 139 mmol/L (ref 135–145)
Total Bilirubin: 1.4 mg/dL — ABNORMAL HIGH (ref 0.3–1.2)
Total Protein: 7.6 g/dL (ref 6.5–8.1)

## 2019-10-23 LAB — PROTIME-INR
INR: 1 (ref 0.8–1.2)
Prothrombin Time: 12.9 seconds (ref 11.4–15.2)

## 2019-10-23 SURGERY — EXAM UNDER ANESTHESIA
Anesthesia: General | Site: Rectum

## 2019-10-23 MED ORDER — PROPOFOL 10 MG/ML IV BOLUS
INTRAVENOUS | Status: AC
  Filled 2019-10-23: qty 40

## 2019-10-23 MED ORDER — ACETAMINOPHEN 500 MG PO TABS
ORAL_TABLET | ORAL | Status: AC
  Filled 2019-10-23: qty 2

## 2019-10-23 MED ORDER — CHLORHEXIDINE GLUCONATE CLOTH 2 % EX PADS
6.0000 | MEDICATED_PAD | Freq: Once | CUTANEOUS | Status: DC
  Filled 2019-10-23: qty 6

## 2019-10-23 MED ORDER — BUPIVACAINE LIPOSOME 1.3 % IJ SUSP
INTRAMUSCULAR | Status: DC | PRN
  Administered 2019-10-23: 20 mL

## 2019-10-23 MED ORDER — DEXAMETHASONE SODIUM PHOSPHATE 10 MG/ML IJ SOLN
INTRAMUSCULAR | Status: DC | PRN
  Administered 2019-10-23: 10 mg via INTRAVENOUS

## 2019-10-23 MED ORDER — TRAMADOL HCL 50 MG PO TABS: 50.0000 mg | ORAL_TABLET | Freq: Four times a day (QID) | ORAL | 0 refills | Status: AC | PRN

## 2019-10-23 MED ORDER — SUCCINYLCHOLINE CHLORIDE 200 MG/10ML IV SOSY
PREFILLED_SYRINGE | INTRAVENOUS | Status: AC
  Filled 2019-10-23: qty 10

## 2019-10-23 MED ORDER — DIBUCAINE (PERIANAL) 1 % EX OINT
TOPICAL_OINTMENT | CUTANEOUS | Status: DC | PRN
  Administered 2019-10-23: 1 via RECTAL

## 2019-10-23 MED ORDER — LACTATED RINGERS IV SOLN
INTRAVENOUS | Status: DC
  Filled 2019-10-23: qty 1000

## 2019-10-23 MED ORDER — FENTANYL CITRATE (PF) 100 MCG/2ML IJ SOLN
25.0000 ug | INTRAMUSCULAR | Status: DC | PRN
  Filled 2019-10-23: qty 1

## 2019-10-23 MED ORDER — MIDAZOLAM HCL 5 MG/5ML IJ SOLN
INTRAMUSCULAR | Status: DC | PRN
  Administered 2019-10-23: 2 mg via INTRAVENOUS

## 2019-10-23 MED ORDER — BUPIVACAINE LIPOSOME 1.3 % IJ SUSP
20.0000 mL | Freq: Once | INTRAMUSCULAR | Status: DC
  Filled 2019-10-23: qty 20

## 2019-10-23 MED ORDER — PROPOFOL 10 MG/ML IV BOLUS
INTRAVENOUS | Status: DC | PRN
  Administered 2019-10-23: 180 mg via INTRAVENOUS

## 2019-10-23 MED ORDER — ONDANSETRON HCL 4 MG/2ML IJ SOLN
INTRAMUSCULAR | Status: AC
  Filled 2019-10-23: qty 2

## 2019-10-23 MED ORDER — ACETAMINOPHEN 500 MG PO TABS
1000.0000 mg | ORAL_TABLET | ORAL | Status: AC
  Administered 2019-10-23: 1000 mg via ORAL
  Filled 2019-10-23: qty 2

## 2019-10-23 MED ORDER — DEXAMETHASONE SODIUM PHOSPHATE 10 MG/ML IJ SOLN
INTRAMUSCULAR | Status: AC
  Filled 2019-10-23: qty 1

## 2019-10-23 MED ORDER — LIDOCAINE 2% (20 MG/ML) 5 ML SYRINGE
INTRAMUSCULAR | Status: DC | PRN
  Administered 2019-10-23: 100 mg via INTRAVENOUS

## 2019-10-23 MED ORDER — SUCCINYLCHOLINE CHLORIDE 200 MG/10ML IV SOSY
PREFILLED_SYRINGE | INTRAVENOUS | Status: DC | PRN
  Administered 2019-10-23: 100 mg via INTRAVENOUS

## 2019-10-23 MED ORDER — ONDANSETRON HCL 4 MG/2ML IJ SOLN
INTRAMUSCULAR | Status: DC | PRN
  Administered 2019-10-23: 4 mg via INTRAVENOUS

## 2019-10-23 MED ORDER — PROMETHAZINE HCL 25 MG/ML IJ SOLN
6.2500 mg | INTRAMUSCULAR | Status: DC | PRN
  Filled 2019-10-23: qty 1

## 2019-10-23 MED ORDER — FENTANYL CITRATE (PF) 250 MCG/5ML IJ SOLN
INTRAMUSCULAR | Status: DC | PRN
  Administered 2019-10-23: 100 ug via INTRAVENOUS

## 2019-10-23 MED ORDER — MIDAZOLAM HCL 2 MG/2ML IJ SOLN
INTRAMUSCULAR | Status: AC
  Filled 2019-10-23: qty 2

## 2019-10-23 MED ORDER — BUPIVACAINE HCL (PF) 0.25 % IJ SOLN
INTRAMUSCULAR | Status: DC | PRN
  Administered 2019-10-23: 30 mL

## 2019-10-23 MED ORDER — FENTANYL CITRATE (PF) 100 MCG/2ML IJ SOLN
INTRAMUSCULAR | Status: AC
  Filled 2019-10-23: qty 2

## 2019-10-23 SURGICAL SUPPLY — 62 items
APL SKNCLS STERI-STRIP NONHPOA (GAUZE/BANDAGES/DRESSINGS) ×4
BENZOIN TINCTURE PRP APPL 2/3 (GAUZE/BANDAGES/DRESSINGS) ×8 IMPLANT
BLADE EXTENDED COATED 6.5IN (ELECTRODE) IMPLANT
BLADE HEX COATED 2.75 (ELECTRODE) ×4 IMPLANT
BLADE SURG 10 STRL SS (BLADE) IMPLANT
BLADE SURG 15 STRL LF DISP TIS (BLADE) ×2 IMPLANT
BLADE SURG 15 STRL SS (BLADE) ×4
BRIEF STRETCH FOR OB PAD LRG (UNDERPADS AND DIAPERS) ×4 IMPLANT
CANISTER SUCT 3000ML PPV (MISCELLANEOUS) ×4 IMPLANT
COVER BACK TABLE 60X90IN (DRAPES) ×4 IMPLANT
COVER MAYO STAND STRL (DRAPES) ×4 IMPLANT
COVER WAND RF STERILE (DRAPES) ×4 IMPLANT
DRAPE HYSTEROSCOPY (DRAPE) IMPLANT
DRAPE LAPAROTOMY 100X72 PEDS (DRAPES) ×4 IMPLANT
DRAPE SHEET LG 3/4 BI-LAMINATE (DRAPES) IMPLANT
DRAPE UTILITY XL STRL (DRAPES) ×4 IMPLANT
DRSG PAD ABDOMINAL 8X10 ST (GAUZE/BANDAGES/DRESSINGS) ×4 IMPLANT
GAUZE SPONGE 4X4 12PLY STRL (GAUZE/BANDAGES/DRESSINGS) ×4 IMPLANT
GAUZE SPONGE 4X4 12PLY STRL LF (GAUZE/BANDAGES/DRESSINGS) ×2 IMPLANT
GLOVE BIO SURGEON STRL SZ7.5 (GLOVE) ×4 IMPLANT
GLOVE INDICATOR 8.0 STRL GRN (GLOVE) ×4 IMPLANT
GOWN STRL REUS W/ TWL XL LVL3 (GOWN DISPOSABLE) ×2 IMPLANT
GOWN STRL REUS W/TWL XL LVL3 (GOWN DISPOSABLE) ×4
HYDROGEN PEROXIDE 16OZ (MISCELLANEOUS) IMPLANT
IV CATH 14GX2 1/4 (CATHETERS) IMPLANT
IV CATH 18G SAFETY (IV SOLUTION) IMPLANT
KIT TURNOVER CYSTO (KITS) ×4 IMPLANT
LEGGING LITHOTOMY PAIR STRL (DRAPES) IMPLANT
LOOP VESSEL MAXI BLUE (MISCELLANEOUS) IMPLANT
NDL SAFETY ECLIPSE 18X1.5 (NEEDLE) IMPLANT
NEEDLE HYPO 18GX1.5 SHARP (NEEDLE)
NEEDLE HYPO 22GX1.5 SAFETY (NEEDLE) ×4 IMPLANT
NS IRRIG 500ML POUR BTL (IV SOLUTION) ×4 IMPLANT
PACK BASIN DAY SURGERY FS (CUSTOM PROCEDURE TRAY) ×4 IMPLANT
PENCIL BUTTON HOLSTER BLD 10FT (ELECTRODE) ×4 IMPLANT
SPONGE HEMORRHOID 8X3CM (HEMOSTASIS) IMPLANT
SPONGE SURGIFOAM ABS GEL 12-7 (HEMOSTASIS) IMPLANT
SUCTION FRAZIER HANDLE 10FR (MISCELLANEOUS)
SUCTION TUBE FRAZIER 10FR DISP (MISCELLANEOUS) IMPLANT
SUT CHROMIC 2 0 SH (SUTURE) IMPLANT
SUT CHROMIC 3 0 SH 27 (SUTURE) IMPLANT
SUT MNCRL AB 4-0 PS2 18 (SUTURE) ×2 IMPLANT
SUT SILK 0 PSL NDL (SUTURE) IMPLANT
SUT SILK 0 TIES 10X30 (SUTURE) IMPLANT
SUT SILK 2 0 (SUTURE)
SUT SILK 2-0 18XBRD TIE 12 (SUTURE) IMPLANT
SUT VIC AB 2-0 SH 27 (SUTURE)
SUT VIC AB 2-0 SH 27XBRD (SUTURE) IMPLANT
SUT VIC AB 3-0 SH 18 (SUTURE) IMPLANT
SUT VIC AB 3-0 SH 27 (SUTURE)
SUT VIC AB 3-0 SH 27X BRD (SUTURE) IMPLANT
SUT VIC AB 3-0 SH 27XBRD (SUTURE) IMPLANT
SUT VIC AB 4-0 P-3 18XBRD (SUTURE) IMPLANT
SUT VIC AB 4-0 P3 18 (SUTURE)
SYR 20ML LL LF (SYRINGE) IMPLANT
SYR BULB IRRIGATION 50ML (SYRINGE) ×4 IMPLANT
SYR CONTROL 10ML LL (SYRINGE) ×4 IMPLANT
TOWEL OR 17X26 10 PK STRL BLUE (TOWEL DISPOSABLE) ×4 IMPLANT
TRAY DSU PREP LF (CUSTOM PROCEDURE TRAY) ×4 IMPLANT
TUBE CONNECTING 12'X1/4 (SUCTIONS) ×1
TUBE CONNECTING 12X1/4 (SUCTIONS) ×3 IMPLANT
YANKAUER SUCT BULB TIP NO VENT (SUCTIONS) ×4 IMPLANT

## 2019-10-23 NOTE — Anesthesia Postprocedure Evaluation (Signed)
Anesthesia Post Note  Patient: [name]  Procedure(s) Performed: ANORECTAL EXAM UNDER ANESTHESIA (N/A ) ANAL FISTULOTOMY, EXCISION OF LEFT POSTERIOR AND LEFT LATERAL PERIANAL SKIN NODULES (N/A Rectum)     Patient location during evaluation: PACU Anesthesia Type: General Level of consciousness: awake and alert Pain management: pain level controlled Vital Signs Assessment: post-procedure vital signs reviewed and stable Respiratory status: spontaneous breathing, nonlabored ventilation and respiratory function stable Cardiovascular status: blood pressure returned to baseline and stable Postop Assessment: no apparent nausea or vomiting Anesthetic complications: no    Last Vitals:  Vitals:   10/23/19 0900 10/23/19 1045  BP: 112/81 121/90  Pulse: (!) 52 (!) 58  Resp: 10 16  Temp:  37 C  SpO2: 100% 95%    Last Pain:  Vitals:   10/23/19 1045  TempSrc:   PainSc: 2                  Cecile Hearing

## 2019-10-23 NOTE — Transfer of Care (Signed)
Immediate Anesthesia Transfer of Care Note  Patient: [name]  Procedure(s) Performed: ANORECTAL EXAM UNDER ANESTHESIA (N/A ) ANAL FISTULOTOMY, EXCISION OF PERIANAL SKIN NODULES (N/A Rectum)  Patient Location: PACU  Anesthesia Type:General  Level of Consciousness: awake and patient cooperative  Airway & Oxygen Therapy: Patient Spontanous Breathing and Patient connected to face mask oxygen  Post-op Assessment: Report given to RN and Post -op Vital signs reviewed and stable  Post vital signs: Reviewed and stable  Last Vitals:  Vitals Value Taken Time  BP 121/78 10/23/19 0822  Temp 36.6 C 10/23/19 0822  Pulse 72 10/23/19 0822  Resp 13 10/23/19 0822  SpO2 100 % 10/23/19 0822    Last Pain:  Vitals:   10/23/19 0607  TempSrc:   PainSc: 0-No pain         Complications: No apparent anesthesia complications

## 2019-10-23 NOTE — Discharge Instructions (Addendum)
ANORECTAL SURGERY: POST OP INSTRUCTIONS  You had a short fistula where your fissure was located that was opened up to reduce the change of any subsequent abscess (infection). There were also 2 skin lesions/tags that were removed.  1. DIET: Follow a light bland diet the first 24 after arrival home, such as soup, liquids, crackers, etc.  Be sure to include lots of fluids daily.  Avoid fast food or heavy meals as your are more likely to get nauseated.  Eat a low fat diet the next few days after surgery.    2. Take your usually prescribed home medications unless otherwise directed.  3. PAIN CONTROL: a. It is helpful to take an over-the-counter pain medication regularly for the first few days/weeks.  Choose from the following that works best for you: i. Ibuprofen (Advil, etc) Three 200mg  tabs every 6 hours as needed ii. Acetaminophen (Tylenol, etc) 500-650mg  every 6 hours as needed iii. NOTE: You may take both of these medications together - most patients find it most helpful when alternating between the two (i.e. Ibuprofen at 6am, tylenol at 9am, ibuprofen at 12pm ...) b. A  prescription for pain medication may have been prescribed for you at discharge.  Take your pain medication as prescribed.  i. If you are having problems/concerns with the prescription medicine, please call us for further advice.  4. Avoid getting constipated.  Between the surgery and the pain medications, it is common to experience some constipation.  Increasing fluid intake (64oz of water per day) and taking a fiber supplement (such as Metamucil, Citrucel, FiberCon) 1-2 times a day regularly will usually help prevent this problem from occurring.  Take Miralax (over the counter) 1-2x/day while taking a narcotic pain medication. If no bowel movement after 48hours, you may additionally take a laxative like a bottle of Milk of Magnesia which can be purchased over the counter. Avoid enemas if possible as these are often painful.    5. Watch out for diarrhea.  If you have many loose bowel movements, simplify your diet to bland foods.  Stop any stool softeners and decrease your fiber supplement. If this worsens or does not improve, please call us.  6. Wash / shower every day.  If you were discharged with a dressing, you may remove this the day after your surgery. You may shower normally, getting soap/water on your wound, particularly after bowel movements.  7. Soaking in a warm bath filled a couple inches ("Sitz bath") is a great way to clean the area after a bowel movement and many patients find it is a way to soothe the area.  8. ACTIVITIES as tolerated:   a. You may resume regular (light) daily activities beginning the next day--such as daily self-care, walking, climbing stairs--gradually increasing activities as tolerated.  If you can walk 30 minutes without difficulty, it is safe to try more intense activity such as jogging, treadmill, bicycling, low-impact aerobics, etc. b. Refrain from any heavy lifting or straining for the first 2 weeks after your procedure, particularly if your surgery was for hemorrhoids. c. Avoid activities that make your pain worse d. You may drive when you are no longer taking prescription pain medication, you can comfortably wear a seatbelt, and you can safely maneuver your car and apply brakes.  9. FOLLOW UP in our office a. Please call [facility] at [phone] to set up an appointment to see your surgeon in the office for a follow-up appointment approximately 2 weeks after your surgery. b. Make sure  that you call for this appointment the day you arrive home to insure a convenient appointment time.  9. If you have disability or family leave forms that need to be completed, you may have them completed by your primary care physician's office; for return to work instructions, please ask our office staff and they will be happy to assist you in obtaining this documentation   When to call us [phone]  [phone]: 1. Poor pain control 2. Reactions / problems with new medications (rash/itching, etc)  3. Fever over 101.5 F (38.5 C) 4. Inability to urinate 5. Nausea/vomiting 6. Worsening swelling or bruising 7. Continued bleeding from incision. 8. Increased pain, redness, or drainage from the incision  The clinic staff is available to answer your questions during regular business hours (8:30am-5pm).  Please don't hesitate to call and ask to speak to one of our nurses for clinical concerns.   A surgeon from [facility] is always on call at the hospitals   If you have a medical emergency, go to the nearest emergency room or call 911.   [facility] [address], [place], [place]  [address] ? MAIN: [phone] FAX [phone] [email or web address]  Information for Discharge Teaching: EXPAREL (bupivacaine liposome injectable suspension)   Your surgeon or anesthesiologist gave you EXPAREL(bupivacaine) to help control your pain after surgery.   EXPAREL is a local anesthetic that provides pain relief by numbing the tissue around the surgical site.  EXPAREL is designed to release pain medication over time and can control pain for up to 72 hours.  Depending on how you respond to EXPAREL, you may require less pain medication during your recovery.  Possible side effects:  Temporary loss of sensation or ability to move in the area where bupivacaine was injected.  Nausea, vomiting, constipation  Rarely, numbness and tingling in your mouth or lips, lightheadedness, or anxiety may occur.  Call your doctor right away if you think you may be experiencing any of these sensations, or if you have other questions regarding possible side effects.  Follow all other discharge instructions given to you by your surgeon or nurse. Eat a healthy diet and drink plenty of water or other fluids.  If you return to the hospital for any reason within  96 hours following the administration of EXPAREL, it is important for health care providers to know that you have received this anesthetic. A teal colored band has been placed on your arm with the date, time and amount of EXPAREL you have received in order to alert and inform your health care providers. Please leave this armband in place for the full 96 hours following administration, and then you may remove the band.Information for Discharge Teaching: EXPAREL (bupivacaine liposome injectable suspension)   Your surgeon or anesthesiologist gave you EXPAREL(bupivacaine) to help control your pain after surgery.   EXPAREL is a local anesthetic that provides pain relief by numbing the tissue around the surgical site.  EXPAREL is designed to release pain medication over time and can control pain for up to 72 hours.  Depending on how you respond to EXPAREL, you may require less pain medication during your recovery.  Possible side effects:  Temporary loss of sensation or ability to move in the area where bupivacaine was injected.  Nausea, vomiting, constipation  Rarely, numbness and tingling in your mouth or lips, lightheadedness, or anxiety may occur.  Call your doctor right away if you think you may be experiencing any of  these sensations, or if you have other questions regarding possible side effects.  Follow all other discharge instructions given to you by your surgeon or nurse. Eat a healthy diet and drink plenty of water or other fluids.  If you return to the hospital for any reason within 96 hours following the administration of EXPAREL, it is important for health care providers to know that you have received this anesthetic. A teal colored band has been placed on your arm with the date, time and amount of EXPAREL you have received in order to alert and inform your health care providers. Please leave this armband in place for the full 96 hours following administration, and then you may remove  the band.Information for Discharge Teaching: EXPAREL (bupivacaine liposome injectable suspension)   Your surgeon or anesthesiologist gave you EXPAREL(bupivacaine) to help control your pain after surgery.   EXPAREL is a local anesthetic that provides pain relief by numbing the tissue around the surgical site.  EXPAREL is designed to release pain medication over time and can control pain for up to 72 hours.  Depending on how you respond to EXPAREL, you may require less pain medication during your recovery.  Possible side effects:  Temporary loss of sensation or ability to move in the area where bupivacaine was injected.  Nausea, vomiting, constipation  Rarely, numbness and tingling in your mouth or lips, lightheadedness, or anxiety may occur.  Call your doctor right away if you think you may be experiencing any of these sensations, or if you have other questions regarding possible side effects.  Follow all other discharge instructions given to you by your surgeon or nurse. Eat a healthy diet and drink plenty of water or other fluids.  If you return to the hospital for any reason within 96 hours following the administration of EXPAREL, it is important for health care providers to know that you have received this anesthetic. A teal colored band has been placed on your arm with the date, time and amount of EXPAREL you have received in order to alert and inform your health care providers. Please leave this armband in place for the full 96 hours following administration, and then you may remove the band.  May remove green armband Monday, October 26, 2019.   Post Anesthesia Home Care Instructions  Activity: Get plenty of rest for the remainder of the day. A responsible individual must stay with you for 24 hours following the procedure.  For the next 24 hours, DO NOT: -Drive a car -Advertising copywriter -Drink alcoholic beverages -Take any medication unless instructed by your physician -Make  any legal decisions or sign important papers.  Meals: Start with liquid foods such as gelatin or soup. Progress to regular foods as tolerated. Avoid greasy, spicy, heavy foods. If nausea and/or vomiting occur, drink only clear liquids until the nausea and/or vomiting subsides. Call your physician if vomiting continues.  Special Instructions/Symptoms: Your throat may feel dry or sore from the anesthesia or the breathing tube placed in your throat during surgery. If this causes discomfort, gargle with warm salt water. The discomfort should disappear within 24 hours.

## 2019-10-23 NOTE — Anesthesia Procedure Notes (Signed)
Procedure Name: Intubation Date/Time: 10/23/2019 7:40 AM Performed by: Elyn Peers, CRNA Pre-anesthesia Checklist: Patient identified, Emergency Drugs available, Suction available, Patient being monitored and Timeout performed Patient Re-evaluated:Patient Re-evaluated prior to induction Oxygen Delivery Method: Circle system utilized Preoxygenation: Pre-oxygenation with 100% oxygen Induction Type: IV induction Ventilation: Mask ventilation without difficulty Laryngoscope Size: Miller and 3 Grade View: Grade I Tube type: Oral Tube size: 7.5 mm Number of attempts: 1 Airway Equipment and Method: Stylet Placement Confirmation: ETT inserted through vocal cords under direct vision,  positive ETCO2 and breath sounds checked- equal and bilateral Secured at: 23 cm Tube secured with: Tape Dental Injury: Teeth and Oropharynx as per pre-operative assessment

## 2019-10-23 NOTE — H&P (Signed)
CC: Here today for surgery  HPI: [name] is a very pleasant 53yoM with hx of perianal pain that he first noticed 05/26/2019 - severe and knifelike with bowel movements. He also hasn't drained that time. He reported some tissue prolapse as well on one side. He denies any fever/chills. His symptoms have steadily improved but have not resolved. He has previously seen Dr. 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 He is not currently taking a fiber supplement. He has tried hydrocortisone cream with little improvement. He drinks 2-3 glasses of water per day. He spends 5-10 minutes on the commode. He reports having 1 soft, occasionally hard bowel movement per day. He has had intermittent drainage after experiencing some swelling in the perianal area and it seems to cyclically recur every 1-2 months.  He also reports having a tag of skin and the anal area is bothersome.  He was seen and examined in the office and denies any changes in his health or health history. He denies any anal pain with BMs or symptoms similar to his prior fissure. Still with bothersome tag.  PMH: Perianal pain, inguinal hernia  PSH: Appendectomy, hernia surgery  FHx: Denies FHx of malignancy  Social: Denies use of tobacco/drugs; occasional EtOH use; works at [employer]  ROS: A comprehensive 10 system review of systems was completed with the patient and pertinent findings as noted above.   Past Medical History:  Diagnosis Date  . Anxiety     Past Surgical History:  Procedure Laterality Date  . APPENDECTOMY    . HERNIA REPAIR    . KNEE ARTHROSCOPY W/ MENISCAL REPAIR Bilateral   . RIH     left done also    Family History  Problem Relation Age of Onset  . Ovarian cancer Mother   . Alzheimer's disease Father 34  . Heart disease Sister   . Other Brother        Malaria  . Heart Problems Brother   . Alcohol abuse Brother   . Thyroid cancer  Sister     Social:  reports that he has never smoked. He has never used smokeless tobacco. He reports previous alcohol use. He reports that he does not use drugs.  Allergies: No Known Allergies  Medications: I have reviewed the patient's current medications.  Results for orders placed or performed during the hospital encounter of 10/23/19 (from the past 48 hour(s))  APTT     Status: None   Collection Time: 10/23/19  6:15 AM  Result Value Ref Range   aPTT 28 24 - 36 seconds    Comment: Performed at [facility], [address], [place], [place] [address]  CBC WITH DIFFERENTIAL     Status: Abnormal   Collection Time: 10/23/19  6:15 AM  Result Value Ref Range   WBC 6.3 4.0 - 10.5 K/uL   RBC 5.49 4.22 - 5.81 MIL/uL   Hemoglobin 18.2 (H) 13.0 - 17.0 g/dL   HCT 85.6 (H) 31.4 - 97.0 %   MCV 96.2 80.0 - 100.0 fL   MCH 33.2 26.0 - 34.0 pg   MCHC 34.5 30.0 - 36.0 g/dL   RDW 26.3 78.5 - 88.5 %   Platelets 225 150 - 400 K/uL    Comment: REPEATED TO VERIFY PLATELET COUNT CONFIRMED BY SMEAR SPECIMEN CHECKED FOR CLOTS    nRBC 0.0 0.0 - 0.2 %   Neutrophils Relative % 56 %   Neutro Abs 3.5  1.7 - 7.7 K/uL   Lymphocytes Relative 33 %   Lymphs Abs 2.1 0.7 - 4.0 K/uL   Monocytes Relative 7 %   Monocytes Absolute 0.4 0.1 - 1.0 K/uL   Eosinophils Relative 3 %   Eosinophils Absolute 0.2 0.0 - 0.5 K/uL   Basophils Relative 1 %   Basophils Absolute 0.0 0.0 - 0.1 K/uL   Immature Granulocytes 0 %   Abs Immature Granulocytes 0.02 0.00 - 0.07 K/uL    Comment: Performed at [facility], [address] [place], [place] [address]  Comprehensive metabolic panel     Status: Abnormal   Collection Time: 10/23/19  6:15 AM  Result Value Ref Range   Sodium 139 135 - 145 mmol/L   Potassium 4.3 3.5 - 5.1 mmol/L   Chloride 102 98 - 111 mmol/L   CO2 27 22 - 32 mmol/L   Glucose, Bld 92 70 - 99 mg/dL   BUN 15 6 - 20 mg/dL   Creatinine, Ser 1.54 0.61 - 1.24 mg/dL    Calcium 9.4 8.9 - 00.8 mg/dL   Total Protein 7.6 6.5 - 8.1 g/dL   Albumin 4.6 3.5 - 5.0 g/dL   AST 41 15 - 41 U/L   ALT 36 0 - 44 U/L   Alkaline Phosphatase 72 38 - 126 U/L   Total Bilirubin 1.4 (H) 0.3 - 1.2 mg/dL   GFR calc non Af Amer >60 >60 mL/min   GFR calc Af Amer >60 >60 mL/min   Anion gap 10 5 - 15    Comment: Performed at [facility], [address] [place], [place] [address]  Protime-INR     Status: None   Collection Time: 10/23/19  6:15 AM  Result Value Ref Range   Prothrombin Time 12.9 11.4 - 15.2 seconds   INR 1.0 0.8 - 1.2    Comment: (NOTE) INR goal varies based on device and disease states. Performed at [facility], [address], [place], [place] [address]     No results found.  ROS - all of the below systems have been reviewed with the patient and positives are indicated with bold text General: chills, fever or night sweats Eyes: blurry vision or double vision ENT: epistaxis or sore throat Allergy/Immunology: itchy/watery eyes or nasal congestion Hematologic/Lymphatic: bleeding problems, blood clots or swollen lymph nodes Endocrine: temperature intolerance or unexpected weight changes Breast: new or changing breast lumps or nipple discharge Resp: cough, shortness of breath, or wheezing CV: chest pain or dyspnea on exertion GI: as per HPI GU: dysuria, trouble voiding, or hematuria MSK: joint pain or joint stiffness Neuro: TIA or stroke symptoms Derm: pruritus and skin lesion changes Psych: anxiety and depression  PE Blood pressure 127/83, pulse 60, temperature 98.2 F (36.8 C), temperature source Oral, resp. rate 14, height 5\' 7"  (1.702 m), weight 82.3 kg, SpO2 100 %. Constitutional: NAD; conversant Eyes: Moist conjunctiva; no lid lag; anicteric; pupils equal and round Lungs: Normal respiratory effort CV: RRR GI: Abd soft, NT/ND Psychiatric: Appropriate affect; alert and oriented x3  Results for orders  placed or performed during the hospital encounter of 10/23/19 (from the past 48 hour(s))  APTT     Status: None   Collection Time: 10/23/19  6:15 AM  Result Value Ref Range   aPTT 28 24 - 36 seconds    Comment: Performed at [facility], [address], [place], [place]  CBC WITH DIFFERENTIAL  Status: Abnormal   Collection Time: 10/23/19  6:15 AM  Result Value Ref Range   WBC 6.3 4.0 - 10.5 K/uL   RBC 5.49 4.22 - 5.81 MIL/uL   Hemoglobin 18.2 (H) 13.0 - 17.0 g/dL   HCT 80.9 (H) 98.3 - 38.2 %   MCV 96.2 80.0 - 100.0 fL   MCH 33.2 26.0 - 34.0 pg   MCHC 34.5 30.0 - 36.0 g/dL   RDW 50.5 39.7 - 67.3 %   Platelets 225 150 - 400 K/uL    Comment: REPEATED TO VERIFY PLATELET COUNT CONFIRMED BY SMEAR SPECIMEN CHECKED FOR CLOTS    nRBC 0.0 0.0 - 0.2 %   Neutrophils Relative % 56 %   Neutro Abs 3.5 1.7 - 7.7 K/uL   Lymphocytes Relative 33 %   Lymphs Abs 2.1 0.7 - 4.0 K/uL   Monocytes Relative 7 %   Monocytes Absolute 0.4 0.1 - 1.0 K/uL   Eosinophils Relative 3 %   Eosinophils Absolute 0.2 0.0 - 0.5 K/uL   Basophils Relative 1 %   Basophils Absolute 0.0 0.0 - 0.1 K/uL   Immature Granulocytes 0 %   Abs Immature Granulocytes 0.02 0.00 - 0.07 K/uL    Comment: Performed at [facility], [address], [place], [place] [address]  Comprehensive metabolic panel     Status: Abnormal   Collection Time: 10/23/19  6:15 AM  Result Value Ref Range   Sodium 139 135 - 145 mmol/L   Potassium 4.3 3.5 - 5.1 mmol/L   Chloride 102 98 - 111 mmol/L   CO2 27 22 - 32 mmol/L   Glucose, Bld 92 70 - 99 mg/dL   BUN 15 6 - 20 mg/dL   Creatinine, Ser 9.02 0.61 - 1.24 mg/dL   Calcium 9.4 8.9 - 40.9 mg/dL   Total Protein 7.6 6.5 - 8.1 g/dL   Albumin 4.6 3.5 - 5.0 g/dL   AST 41 15 - 41 U/L   ALT 36 0 - 44 U/L   Alkaline Phosphatase 72 38 - 126 U/L   Total Bilirubin 1.4 (H) 0.3 - 1.2 mg/dL   GFR calc non Af Amer >60 >60 mL/min   GFR calc Af Amer >60 >60  mL/min   Anion gap 10 5 - 15    Comment: Performed at [facility], [address] [place], [place] [address]  Protime-INR     Status: None   Collection Time: 10/23/19  6:15 AM  Result Value Ref Range   Prothrombin Time 12.9 11.4 - 15.2 seconds   INR 1.0 0.8 - 1.2    Comment: (NOTE) INR goal varies based on device and disease states. Performed at [facility], [address] [place], [place] [address]     No results found.   A/P: [name] is a very pleasant 53yoM with a likely history of anal fissure that has been healing/healed. 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 The pathophysiology of anal fissures and fistulas was discussed at length as well -He may have underlying fistula in setting of prior fissure based on our evaluation as well as bothersome tag -We discussed options moving forward including further observation which may result in persistence of the symptoms. He is interested in undergoing procedures to address this. We discussed anorectal examining anesthesia, possible fistulotomy, possible seton based upon  intraoperative findings and what these may entail, as well as removal of anal skin tag at his request. -The planned procedures, material risks (including, but not limited to, pain, bleeding, infection, scarring, need for blood transfusion, damage to anal sphincter, incontinence of gas and/or stool, need for additional procedures, recurrence, pneumonia, heart attack, stroke, death) benefits and alternatives to surgery were discussed at length. I noted a good probability that the procedure would help improve his symptoms. The patient's questions were answered to his satisfaction, he voiced understanding and elected to proceed with surgery. Additionally, we discussed typical postoperative expectations and  the recovery process.  Sharon Mt. Dema Severin, M.D. [facility] Use [email or web address] to contact on call provider

## 2019-10-23 NOTE — Op Note (Signed)
10/23/2019  8:25 AM  PATIENT:  [name]  54 y.o. male  Patient Care Team: Shade Flood, MD as PCP - General (Family Medicine)  PRE-OPERATIVE DIAGNOSIS:   1. Possible perianal fistula 2. Anal skin tags 3. Personal history of anal fissure  POST-OPERATIVE DIAGNOSIS:   1. Intersphincteric anal fistula, posterior midline 2. Perianal skin lesion - rule out condyloma 3. Perianal skin tag 4. Personal history of anal fissure  PROCEDURE:   1. Fistulotomy 2. Excision of perianal skin lesion - left posterior (rule out condyloma) 3. Excision of perianal skin tag - left lateral 4. Anorectal exam under anesthesia   SURGEON:  Surgeon(s): Andria Meuse, MD  ASSISTANT: none   ANESTHESIA:   local and general  SPECIMEN:   1. Perianal skin lesions - left posterior - rule out condyloma 2. Perianal skin tag - left lateral  DISPOSITION OF SPECIMEN:  PATHOLOGY  COUNTS:  Sponge, needle, and instrument counts were reported correct x2 at conclusion.  EBL: 5 mL  Drains: None  PLAN OF CARE: Discharge to home after PACU  PATIENT DISPOSITION:  PACU - hemodynamically stable.  INDICATION: [name] is a very pleasant 53yoM with history of anal fissure 05/2019. He developed a bothersome skin tag in the perianal area following. He also was noted on exam to have findings consistent with external opening of a posterior midline anal fistula. We had reviewed options moving forward and he opted to pursue surgery. Please refer to notes elsewhere for details regarding this discussion.  OR FINDINGS: Posterior midline anal canal - short segment intersphincteric fistula with internal opening in posterior midline - location of prior anal fissure. Fistulotomy carried out. 2 separate perianal skin lesions - one appeared more worrisome for condyloma and was excised. The other looked more like perianal skin tag. They were submitted to path separately.  DESCRIPTION: The patient was identified  in the preoperative holding area and taken to the OR where SCDs were placed and he underwent general anesthesia. Following this, he was rolled into the prone jack-knife position on the OR table. Pressure points were padded.  The buttocks were then gently taped apart.  He was then prepped and draped in usual sterile fashion.  A surgical timeout was performed indicating the correct patient, procedure, positioning.  A perianal block was performed using Marcaine with epinephrine and Exparel.    A well lubricated digital rectal exam was performed which demonstrated no palpable masses or abnormalities.  A Hill-Ferguson anoscope was into the anal canal and circumferential inspection demonstrated no open/active anal fissure.  There was an external opening in the posterior midline.  There were also 2 skin lesions on the left side.  The one in the posterior left side appeared consistent with condyloma.  The one in the left lateral location was more consistent with a perianal skin tag/sentinel pile.  A fistula probe was used to cannulate the fistula in the posterior midline.  This was short approximately 1 cm long and shallow.  This was also consistent with an intersphincteric fistula.  Given these findings, a primary fistulotomy was carried out with electrocautery.  Granulation tissue was removed.  The left posterior perianal skin lesion was excised sharply and passed off as specimen.  This was the one it was more worrisome for condyloma.  The left lateral tag was also then excised sharply and passed off as a separate specimen.  Perianal area was irrigated and hemostasis was verified.  4-0 Monocryl subcuticular suture was used to close the  2 locations of the skin lesion removal.  Dibucaine ointment was applied. A dressing consisting of 4 4 gauze, ABD, and mesh underwear was placed.  He was then rolled back onto a stretcher, awakened from anesthesia, extubated, and transferred to the recovery room in satisfactory  condition  DISPOSITION: PACU in satisfactory condition.

## 2019-10-23 NOTE — Progress Notes (Signed)
Patient's wallet, keys and phone returned at time of discharge.  No paperwork found in chart for patient to sign.

## 2019-10-26 LAB — SURGICAL PATHOLOGY

## 2019-12-24 ENCOUNTER — Ambulatory Visit (INDEPENDENT_AMBULATORY_CARE_PROVIDER_SITE_OTHER): Payer: BC Managed Care – PPO

## 2019-12-24 ENCOUNTER — Other Ambulatory Visit: Payer: Self-pay

## 2019-12-24 ENCOUNTER — Ambulatory Visit
Admission: EM | Admit: 2019-12-24 | Discharge: 2019-12-24 | Disposition: A | Discharge status: Home / Self Care | Payer: BC Managed Care – PPO | Attending: Emergency Medicine | Admitting: Emergency Medicine

## 2019-12-24 DIAGNOSIS — M791 Myalgia, unspecified site: Secondary | ICD-10-CM | POA: Diagnosis not present

## 2019-12-24 DIAGNOSIS — R509 Fever, unspecified: Secondary | ICD-10-CM | POA: Diagnosis not present

## 2019-12-24 DIAGNOSIS — R809 Proteinuria, unspecified: Secondary | ICD-10-CM | POA: Diagnosis not present

## 2019-12-24 LAB — COMPREHENSIVE METABOLIC PANEL
ALT: 54 IU/L — ABNORMAL HIGH (ref 0–44)
AST: 47 IU/L — ABNORMAL HIGH (ref 0–40)
Albumin/Globulin Ratio: 1.4 (ref 1.2–2.2)
Albumin: 4 g/dL (ref 3.8–4.9)
Alkaline Phosphatase: 89 IU/L (ref 39–117)
BUN/Creatinine Ratio: 11 (ref 9–20)
BUN: 12 mg/dL (ref 6–24)
Bilirubin Total: 0.3 mg/dL (ref 0.0–1.2)
CO2: 24 mmol/L (ref 20–29)
Calcium: 8.8 mg/dL (ref 8.7–10.2)
Chloride: 98 mmol/L (ref 96–106)
Creatinine, Ser: 1.07 mg/dL (ref 0.76–1.27)
GFR calc Af Amer: 91 mL/min/{1.73_m2} (ref >59)
GFR calc non Af Amer: 79 mL/min/{1.73_m2} (ref >59)
Globulin, Total: 2.8 g/dL (ref 1.5–4.5)
Glucose: 79 mg/dL (ref 65–99)
Potassium: 3.7 mmol/L (ref 3.5–5.2)
Sodium: 138 mmol/L (ref 134–144)
Total Protein: 6.8 g/dL (ref 6.0–8.5)

## 2019-12-24 LAB — CK: Total CK: 81 U/L (ref 41–331)

## 2019-12-24 LAB — POCT URINALYSIS DIP (MANUAL ENTRY)
Bilirubin, UA: NEGATIVE
Blood, UA: NEGATIVE
Glucose, UA: NEGATIVE mg/dL
Ketones, POC UA: NEGATIVE mg/dL
Leukocytes, UA: NEGATIVE
Nitrite, UA: NEGATIVE
Protein Ur, POC: 100 mg/dL — AB
Spec Grav, UA: 1.025 (ref 1.010–1.025)
Urobilinogen, UA: 0.2 E.U./dL
pH, UA: 5.5 (ref 5.0–8.0)

## 2019-12-24 LAB — CBC WITH DIFFERENTIAL/PLATELET
Basophils Absolute: 0 10*3/uL (ref 0.0–0.2)
Basos: 0 %
EOS (ABSOLUTE): 0 10*3/uL (ref 0.0–0.4)
Eos: 0 %
Hematocrit: 47.8 % (ref 37.5–51.0)
Hemoglobin: 16.7 g/dL (ref 13.0–17.7)
Immature Grans (Abs): 0 10*3/uL (ref 0.0–0.1)
Immature Granulocytes: 0 %
Lymphocytes Absolute: 1.2 10*3/uL (ref 0.7–3.1)
Lymphs: 26 %
MCH: 32.5 pg (ref 26.6–33.0)
MCHC: 34.9 g/dL (ref 31.5–35.7)
MCV: 93 fL (ref 79–97)
Monocytes Absolute: 0.4 10*3/uL (ref 0.1–0.9)
Monocytes: 9 %
Neutrophils Absolute: 3.1 10*3/uL (ref 1.4–7.0)
Neutrophils: 65 %
Platelets: 239 10*3/uL (ref 150–450)
RBC: 5.14 x10E6/uL (ref 4.14–5.80)
RDW: 11.8 % (ref 11.6–15.4)
WBC: 4.8 10*3/uL (ref 3.4–10.8)

## 2019-12-24 LAB — POC SARS CORONAVIRUS 2 AG -  ED: SARS Coronavirus 2 Ag: NEGATIVE

## 2019-12-24 MED ORDER — ACETAMINOPHEN 500 MG PO TABS: 1000.0000 mg | ORAL_TABLET | Freq: Four times a day (QID) | ORAL | 0 refills | Status: AC | PRN

## 2019-12-24 MED ORDER — ACETAMINOPHEN 325 MG PO TABS
975.0000 mg | ORAL_TABLET | Freq: Once | ORAL | Status: AC
  Administered 2019-12-24: 975 mg via ORAL

## 2019-12-24 NOTE — ED Provider Notes (Addendum)
[name]    CSN: [id number] Arrival date & time: 12/24/19  0957      History   Chief Complaint Chief Complaint  Patient presents with  . Fever    HPI [name] is a 54 y.o. male has significant medical history presenting for intermittent fever, chills, fatigue, myalgias over the last 2 weeks.  Patient initially thought it was allergies: Has been taking Alka-Seltzer sinus and cold which "usually helps", though has not.  Patient is unsure of T-max; no temporality to fever.  States Alka-Seltzer helps him sleep.  Patient denies recent travel, sick contacts, though does work in a [employer], sport and exercise.  Denies recent accident, injury, surgery, change in medications or lifestyle.  Patient did undergo an anorectal exam under general anesthesia in late January: States this went well and was without complications.  Denies painful bowel movements, change in bowel habit, hematochezia, melena.  Denies penile or testicular pain, swelling, penile discharge, urinary symptoms such as frequency, urgency, retention, hematuria.  States urine is clear in color.  Patient denying nasal congestion, sore throat, difficulty breathing or swallowing, ear pain, headache, change in vision or hearing.  Patient denies cough, wheezing, dyspnea, chest pain or palpitations.  No lower extremity edema.  Denies easy bruising or bleeding.  Denies back or abdominal pain.  Denies history of diabetes, hypertension, thyroid disease, autoimmune disease.    Past Medical History:  Diagnosis Date  . Anxiety     Patient Active Problem List   Diagnosis Date Noted  . Elevated cholesterol 09/07/2016    Past Surgical History:  Procedure Laterality Date  . ANAL FISTULOTOMY N/A 10/23/2019   Procedure: ANAL FISTULOTOMY, EXCISION OF LEFT POSTERIOR AND LEFT LATERAL PERIANAL SKIN NODULES;  Surgeon: Andria Meuse, MD;  Location: [facility];  Service: General;  Laterality: N/A;  . APPENDECTOMY    .  HERNIA REPAIR    . KNEE ARTHROSCOPY W/ MENISCAL REPAIR Bilateral   . RIH     left done also       Home Medications    Prior to Admission medications   Medication Sig Start Date End Date Taking? Authorizing Provider  acetaminophen (TYLENOL) 500 MG tablet Take 2 tablets (1,000 mg total) by mouth every 6 (six) hours as needed. DO NOT TAKE MORE THAN 8 TABS IN A 24 HOUR PERIOD 12/24/19   Hall-Potvin, [place], PA-C  GLUCOSAMINE-CHONDROITIN PO Take 1 tablet by mouth daily.    [provider]  Providence Lanius 500 MG CAPS Take by mouth daily.    [provider]    Family History Family History  Problem Relation Age of Onset  . Ovarian cancer Mother   . Alzheimer's disease Father 61  . Heart disease Sister   . Other Brother        Malaria  . Heart Problems Brother   . Alcohol abuse Brother   . Thyroid cancer Sister     Social History Social History   Tobacco Use  . Smoking status: Never Smoker  . Smokeless tobacco: Never Used  Substance Use Topics  . Alcohol use: Not Currently  . Drug use: No     Allergies   Patient has no known allergies.   Review of Systems As per HPI   Physical Exam Triage Vital Signs ED Triage Vitals  Enc Vitals Group     BP      Pulse      Resp      Temp  Temp src      SpO2      Weight      Height      Head Circumference      Peak Flow      Pain Score      Pain Loc      Pain Edu?      Excl. in [place]?    No data found.  Updated Vital Signs BP 110/74 (BP Location: Left Arm)   Pulse 91   Temp (!) 100.7 F (38.2 C) (Oral)   Resp 18   SpO2 92%   Visual Acuity Right Eye Distance:   Left Eye Distance:   Bilateral Distance:    Right Eye Near:   Left Eye Near:    Bilateral Near:     Physical Exam Constitutional:      General: He is not in acute distress.    Appearance: He is normal weight. He is ill-appearing. He is not toxic-appearing or diaphoretic.  HENT:     Head: Normocephalic and atraumatic.     Right  Ear: Tympanic membrane, ear canal and external ear normal.     Left Ear: Tympanic membrane, ear canal and external ear normal.     Ears:     Comments: Negative tragal tenderness bilaterally    Nose: Nose normal.     Mouth/Throat:     Mouth: Mucous membranes are moist.     Pharynx: Oropharynx is clear. No oropharyngeal exudate or posterior oropharyngeal erythema.  Eyes:     General: No scleral icterus.       Right eye: No discharge.        Left eye: No discharge.     Extraocular Movements: Extraocular movements intact.     Conjunctiva/sclera: Conjunctivae normal.     Pupils: Pupils are equal, round, and reactive to light.  Cardiovascular:     Rate and Rhythm: Normal rate and regular rhythm.     Heart sounds: No murmur. No gallop.   Pulmonary:     Effort: Pulmonary effort is normal. No respiratory distress.     Breath sounds: No stridor. No wheezing, rhonchi or rales.  Abdominal:     General: Bowel sounds are normal. There is no distension.     Palpations: Abdomen is soft. There is no mass.     Tenderness: There is abdominal tenderness. There is no right CVA tenderness, left CVA tenderness, guarding or rebound.     Comments: RUQ TTP without rebound.  Negative Murphy's, McBurney's, Rovsing sign.  Musculoskeletal:        General: Tenderness present. No swelling. Normal range of motion.     Cervical back: Normal range of motion and neck supple. No rigidity or tenderness.     Right lower leg: No edema.     Left lower leg: No edema.     Comments: Bilateral quadricep TTP  Lymphadenopathy:     Cervical: No cervical adenopathy.  Skin:    Capillary Refill: Capillary refill takes less than 2 seconds.     Coloration: Skin is not jaundiced or pale.  Neurological:     General: No focal deficit present.     Mental Status: He is alert and oriented to person, place, and time.  Psychiatric:        Mood and Affect: Mood normal.        Behavior: Behavior normal.        Thought Content: Thought  content normal.        Judgment: Judgment  normal.      UC Treatments / Results  Labs (all labs ordered are listed, but only abnormal results are displayed) Labs Reviewed  POCT URINALYSIS DIP (MANUAL ENTRY) - Abnormal; Notable for the following components:      Result Value   Protein Ur, POC =100 (*)    All other components within normal limits  NOVEL CORONAVIRUS, NAA  CBC WITH DIFFERENTIAL/PLATELET  COMPREHENSIVE METABOLIC PANEL  CK  POC SARS CORONAVIRUS 2 AG -  ED    EKG   Radiology DG Chest 2 View  Result Date: 12/24/2019 CLINICAL DATA:  Fever.  Hypoxia. EXAM: CHEST - 2 VIEW COMPARISON:  05/21/2010 FINDINGS: The heart size and mediastinal contours are within normal limits. Both lungs are clear. The visualized skeletal structures are unremarkable. IMPRESSION: No active cardiopulmonary disease. Electronically Signed   By: Francene Boyers M.D.   On: 12/24/2019 11:17    Procedures Procedures (including critical care time)  Medications Ordered in UC Medications  acetaminophen (TYLENOL) tablet 975 mg (975 mg Oral Given 12/24/19 1021)    Initial Impression / Assessment and Plan / UC Course  I have reviewed the triage vital signs and the nursing notes.  Pertinent labs & imaging results that were available during my care of the patient were reviewed by me and considered in my medical decision making (see chart for details).     Patient febrile (101.50F), nontoxic without respiratory distress in office today.  Patient is hypoxic compared to baseline (presenting at 90%).  Rapid Covid negative, PCR pending.  Chest x-ray done office, reviewed by me radiology and compared to previous from 05/21/2010: Stable/negative for acute cardiopulmonary disease.  Urine dipstick significant for 100 protein.  975 mg APAP given in office which patient tolerated well: Repeat temperature 20 minutes later 100.50F, minimal improvement in malaise/myalgias.  Overall, ROS is unremarkable.  Physical exam  significant for mild RUQ TTP.  Reviewed case with Dr. Leonides Grills who agrees with A/P: We will draw basic labs CBC with differential, CMP, CK to screen for acute cholecystitis, less likely rhabdomyolysis - will compare to previous CBC, CMP from 10/23/19.  Confirmed patient's phone number, informed him that labs will be resulted later this evening and to expect phone call.  Otherwise, will manage fever, push fluids, and have patient follow-up with PCP.  ER return precautions discussed, patient verbalized understanding and is agreeable to plan. Final Clinical Impressions(s) / UC Diagnoses   Final diagnoses:  Fever, unspecified fever cause  Myalgia  Proteinuria, unspecified type     Discharge Instructions     Blood work is pending: We should find out later tonight, so please be in your phone and expect our call. Take Tylenol every 6 hours for fever and aches. Important to drink water throughout the day: Goal should be around 1 gallon. Go to ER for uncontrolled fever, worsening anxious, dark urine, severe abdominal pain.    ED Prescriptions    Medication Sig Dispense Auth. Provider   acetaminophen (TYLENOL) 500 MG tablet Take 2 tablets (1,000 mg total) by mouth every 6 (six) hours as needed. DO NOT TAKE MORE THAN 8 TABS IN A 24 HOUR PERIOD 30 tablet Hall-Potvin, [place], PA-C     PDMP not reviewed this encounter.   Hall-Potvin, [place], PA-C 12/24/19 1151    Hall-Potvin, [place], [place] 12/24/19 1152

## 2019-12-24 NOTE — Discharge Instructions (Addendum)
Blood work is pending: We should find out later tonight, so please be in your phone and expect our call. Take Tylenol every 6 hours for fever and aches. Important to drink water throughout the day: Goal should be around 1 gallon. Go to ER for uncontrolled fever, worsening anxious, dark urine, severe abdominal pain.

## 2019-12-24 NOTE — ED Triage Notes (Signed)
Pt c/o fever, chills and fatigue for over the past 2wks. Denies cough, sore throat, congestion or SOB.

## 2019-12-25 LAB — NOVEL CORONAVIRUS, NAA: SARS-CoV-2, NAA: DETECTED — AB

## 2019-12-25 LAB — SARS-COV-2, NAA 2 DAY TAT

## 2020-12-17 ENCOUNTER — Encounter (HOSPITAL_COMMUNITY): Payer: Self-pay | Admitting: *Deleted

## 2020-12-17 ENCOUNTER — Other Ambulatory Visit: Payer: Self-pay

## 2020-12-17 ENCOUNTER — Ambulatory Visit (HOSPITAL_COMMUNITY)
Admission: EM | Admit: 2020-12-17 | Discharge: 2020-12-17 | Disposition: A | Discharge status: Home / Self Care | Payer: BC Managed Care – PPO | Attending: Family Medicine | Admitting: Family Medicine

## 2020-12-17 DIAGNOSIS — S29011A Strain of muscle and tendon of front wall of thorax, initial encounter: Secondary | ICD-10-CM

## 2020-12-17 MED ORDER — NAPROXEN 500 MG PO TABS: 500.0000 mg | ORAL_TABLET | Freq: Two times a day (BID) | ORAL | 0 refills | Status: DC | PRN

## 2020-12-17 MED ORDER — CYCLOBENZAPRINE HCL 5 MG PO TABS: 5.0000 mg | ORAL_TABLET | Freq: Three times a day (TID) | ORAL | 0 refills | Status: DC | PRN

## 2020-12-17 NOTE — ED Triage Notes (Signed)
Pt reports Pain on Rt side of chest and Rt arm pain. Pt reports Pain is worse when moving Rt arm. Pt reports he works for [employer].

## 2020-12-17 NOTE — ED Provider Notes (Signed)
[facility]    CSN: [id number] Arrival date & time: 12/17/20  1357      History   Chief Complaint Chief Complaint  Patient presents with  . Rib Injury  . Arm Pain    RT    HPI [name] is a 55 y.o. male.   Patient presenting today with concern over about a week of right chest wall soreness and arm soreness, worse with heavy lifting or moving the arm.  States he works at [employer] and this happens to him from time to time due to the nature of his work.  Also has some bruising on the underside of his right arm and does not recall injuring the area.  Denies swelling, numbness, tingling, shortness of breath, dizziness, palpitations, history of cardiac issues.  Taking Tylenol with good temporary relief.     Past Medical History:  Diagnosis Date  . Anxiety     Patient Active Problem List   Diagnosis Date Noted  . Elevated cholesterol 09/07/2016    Past Surgical History:  Procedure Laterality Date  . ANAL FISTULOTOMY N/A 10/23/2019   Procedure: ANAL FISTULOTOMY, EXCISION OF LEFT POSTERIOR AND LEFT LATERAL PERIANAL SKIN NODULES;  Surgeon: Andria Meuse, MD;  Location: [facility];  Service: General;  Laterality: N/A;  . APPENDECTOMY    . HERNIA REPAIR    . KNEE ARTHROSCOPY W/ MENISCAL REPAIR Bilateral   . RIH     left done also       Home Medications    Prior to Admission medications   Medication Sig Start Date End Date Taking? Authorizing Provider  cyclobenzaprine (FLEXERIL) 5 MG tablet Take 1 tablet (5 mg total) by mouth 3 (three) times daily as needed for muscle spasms. 12/17/20  Yes Particia Nearing, PA-C  naproxen (NAPROSYN) 500 MG tablet Take 1 tablet (500 mg total) by mouth 2 (two) times daily as needed. 12/17/20  Yes Particia Nearing, PA-C  acetaminophen (TYLENOL) 500 MG tablet Take 2 tablets (1,000 mg total) by mouth every 6 (six) hours as needed. DO NOT TAKE MORE THAN 8 TABS IN A 24 HOUR PERIOD 12/24/19   Hall-Potvin,  [place], PA-C  GLUCOSAMINE-CHONDROITIN PO Take 1 tablet by mouth daily.    [provider]  Prov[facility] Lanius 500 MG CAPS Take by mouth daily.    [provider]    Family History Family History  Problem Relation Age of Onset  . Ovarian cancer Mother   . Alzheimer's disease Father 65  . Heart disease Sister   . Other Brother        Malaria  . Heart Problems Brother   . Alcohol abuse Brother   . Thyroid cancer Sister     Social History Social History   Tobacco Use  . Smoking status: Never Smoker  . Smokeless tobacco: Never Used  Vaping Use  . Vaping Use: Never used  Substance Use Topics  . Alcohol use: Not Currently  . Drug use: No     Allergies   Patient has no known allergies.   Review of Systems Review of Systems Per HPI Physical Exam Triage Vital Signs ED Triage Vitals [12/17/20 1415]  Enc Vitals Group     BP 125/84     Pulse Rate 63     Resp 18     Temp 98 F (36.7 C)     Temp Source Oral     SpO2 98 %     Weight  Height      Head Circumference      Peak Flow      Pain Score 6     Pain Loc      Pain Edu?      Excl. in GC?    No data found.  Updated Vital Signs BP 125/84 (BP Location: Right Arm)   Pulse 63   Temp 98 F (36.7 C) (Oral)   Resp 18   SpO2 98%   Visual Acuity Right Eye Distance:   Left Eye Distance:   Bilateral Distance:    Right Eye Near:   Left Eye Near:    Bilateral Near:     Physical Exam Vitals and nursing note reviewed.  Constitutional:      Appearance: Normal appearance.  HENT:     Head: Atraumatic.     Mouth/Throat:     Mouth: Mucous membranes are moist.     Pharynx: Oropharynx is clear.  Eyes:     Extraocular Movements: Extraocular movements intact.     Conjunctiva/sclera: Conjunctivae normal.  Cardiovascular:     Rate and Rhythm: Normal rate and regular rhythm.     Heart sounds: Normal heart sounds.  Pulmonary:     Effort: Pulmonary effort is normal.     Breath sounds: Normal breath  sounds. No wheezing or rales.  Musculoskeletal:        General: Tenderness present. No swelling or deformity. Normal range of motion.     Cervical back: Normal range of motion and neck supple.     Comments: Reproducible chest wall tenderness to palpation right lateral pectoral muscle exten with ding down laterally to ribs, worse with range of motion exercises adducting right arm toward midline Grip strength full and equal bilateral hands  Skin:    General: Skin is warm and dry.     Findings: Bruising present. No erythema.     Comments: Small bruise present on underside of left upper arm, small bumps present centrally, unclear if these started before the bruising or not but may represent a bug bite or small rash  Neurological:     General: No focal deficit present.     Mental Status: He is oriented to person, place, and time.     Comments: Bilateral upper extremities neurovascularly intact  Psychiatric:        Mood and Affect: Mood normal.        Thought Content: Thought content normal.        Judgment: Judgment normal.      UC Treatments / Results  Labs (all labs ordered are listed, but only abnormal results are displayed) Labs Reviewed - No data to display  EKG   Radiology No results found.  Procedures Procedures (including critical care time)  Medications Ordered in UC Medications - No data to display  Initial Impression / Assessment and Plan / UC Course  I have reviewed the triage vital signs and the nursing notes.  Pertinent labs & imaging results that were available during my care of the patient were reviewed by me and considered in my medical decision making (see chart for details).     Vitals and exam very reassuring today, pain is reproducible and consistent with his physical nature of his work.  He declines EKG today and agreeable to trying naproxen, Flexeril, rest, heat.  Work note given.  Follow-up for acutely worsening symptoms.  Final Clinical  Impressions(s) / UC Diagnoses   Final diagnoses:  Pectoralis muscle strain, initial encounter  Discharge Instructions   None    ED Prescriptions    Medication Sig Dispense Auth. Provider   naproxen (NAPROSYN) 500 MG tablet Take 1 tablet (500 mg total) by mouth 2 (two) times daily as needed. 30 tablet Particia Nearing, [place]   cyclobenzaprine (FLEXERIL) 5 MG tablet Take 1 tablet (5 mg total) by mouth 3 (three) times daily as needed for muscle spasms. 15 tablet Particia Nearing, [place]     PDMP not reviewed this encounter.   Particia Nearing, [place] 12/17/20 1551

## 2021-12-01 ENCOUNTER — Telehealth: Payer: Self-pay | Admitting: Family Medicine

## 2021-12-01 NOTE — Telephone Encounter (Signed)
PT visits today with hopes of getting a TOC from Dr.Greene to Dr.Sagardia! PT states it had been about 2 years since he had seen Dr.Greene and I was not sure if he would still require a TOC or could be placed as a New Patient? Would PT also require confirmation from Dr.Greene before we move forward on our end? ?

## 2022-03-15 ENCOUNTER — Ambulatory Visit: Payer: BC Managed Care – PPO | Admitting: Emergency Medicine

## 2022-05-17 ENCOUNTER — Other Ambulatory Visit: Payer: Self-pay | Admitting: Emergency Medicine

## 2022-05-17 ENCOUNTER — Ambulatory Visit (INDEPENDENT_AMBULATORY_CARE_PROVIDER_SITE_OTHER): Payer: BC Managed Care – PPO | Admitting: Emergency Medicine

## 2022-05-17 ENCOUNTER — Encounter: Payer: Self-pay | Admitting: Emergency Medicine

## 2022-05-17 VITALS — BP 110/72 | HR 60 | Temp 97.8°F | Ht 67.0 in | Wt 190.2 lb

## 2022-05-17 DIAGNOSIS — Z1159 Encounter for screening for other viral diseases: Secondary | ICD-10-CM

## 2022-05-17 DIAGNOSIS — Z1329 Encounter for screening for other suspected endocrine disorder: Secondary | ICD-10-CM

## 2022-05-17 DIAGNOSIS — Z23 Encounter for immunization: Secondary | ICD-10-CM

## 2022-05-17 DIAGNOSIS — Z13220 Encounter for screening for lipoid disorders: Secondary | ICD-10-CM

## 2022-05-17 DIAGNOSIS — Z13228 Encounter for screening for other metabolic disorders: Secondary | ICD-10-CM

## 2022-05-17 DIAGNOSIS — Z125 Encounter for screening for malignant neoplasm of prostate: Secondary | ICD-10-CM | POA: Diagnosis not present

## 2022-05-17 DIAGNOSIS — Z0000 Encounter for general adult medical examination without abnormal findings: Secondary | ICD-10-CM

## 2022-05-17 DIAGNOSIS — Z130 Encounter for screening for diseases of the blood and blood-forming organs and certain disorders involving the immune mechanism: Secondary | ICD-10-CM

## 2022-05-17 DIAGNOSIS — E785 Hyperlipidemia, unspecified: Secondary | ICD-10-CM

## 2022-05-17 DIAGNOSIS — R079 Chest pain, unspecified: Secondary | ICD-10-CM | POA: Diagnosis not present

## 2022-05-17 LAB — COMPREHENSIVE METABOLIC PANEL
ALT: 40 U/L (ref 0–53)
AST: 25 U/L (ref 0–37)
Albumin: 4.3 g/dL (ref 3.5–5.2)
Alkaline Phosphatase: 75 U/L (ref 39–117)
BUN: 16 mg/dL (ref 6–23)
CO2: 30 mEq/L (ref 19–32)
Calcium: 9.5 mg/dL (ref 8.4–10.5)
Chloride: 103 mEq/L (ref 96–112)
Creatinine, Ser: 0.95 mg/dL (ref 0.40–1.50)
GFR: 89.66 mL/min (ref >60.00)
Glucose, Bld: 93 mg/dL (ref 70–99)
Potassium: 4.6 mEq/L (ref 3.5–5.1)
Sodium: 141 mEq/L (ref 135–145)
Total Bilirubin: 0.5 mg/dL (ref 0.2–1.2)
Total Protein: 7.1 g/dL (ref 6.0–8.3)

## 2022-05-17 LAB — CBC WITH DIFFERENTIAL/PLATELET
Basophils Absolute: 0 10*3/uL (ref 0.0–0.1)
Basophils Relative: 0.8 % (ref 0.0–3.0)
Eosinophils Absolute: 0.1 10*3/uL (ref 0.0–0.7)
Eosinophils Relative: 2.1 % (ref 0.0–5.0)
HCT: 48 % (ref 39.0–52.0)
Hemoglobin: 16.5 g/dL (ref 13.0–17.0)
Lymphocytes Relative: 34.6 % (ref 12.0–46.0)
Lymphs Abs: 1.6 10*3/uL (ref 0.7–4.0)
MCHC: 34.4 g/dL (ref 30.0–36.0)
MCV: 97.2 fl (ref 78.0–100.0)
Monocytes Absolute: 0.3 10*3/uL (ref 0.1–1.0)
Monocytes Relative: 7.1 % (ref 3.0–12.0)
Neutro Abs: 2.6 10*3/uL (ref 1.4–7.7)
Neutrophils Relative %: 55.4 % (ref 43.0–77.0)
Platelets: 231 10*3/uL (ref 150.0–400.0)
RBC: 4.94 Mil/uL (ref 4.22–5.81)
RDW: 12.6 % (ref 11.5–15.5)
WBC: 4.7 10*3/uL (ref 4.0–10.5)

## 2022-05-17 LAB — LIPID PANEL
Cholesterol: 219 mg/dL — ABNORMAL HIGH (ref 0–200)
HDL: 43.2 mg/dL (ref >39.00)
NonHDL: 175.7
Total CHOL/HDL Ratio: 5
Triglycerides: 234 mg/dL — ABNORMAL HIGH (ref 0.0–149.0)
VLDL: 46.8 mg/dL — ABNORMAL HIGH (ref 0.0–40.0)

## 2022-05-17 LAB — LDL CHOLESTEROL, DIRECT: Direct LDL: 151 mg/dL

## 2022-05-17 LAB — PSA: PSA: 0.5 ng/mL (ref 0.10–4.00)

## 2022-05-17 LAB — HEMOGLOBIN A1C: Hgb A1c MFr Bld: 5.3 % (ref 4.6–6.5)

## 2022-05-17 MED ORDER — ROSUVASTATIN CALCIUM 10 MG PO TABS: 10.0000 mg | ORAL_TABLET | Freq: Every day | ORAL | 3 refills | Status: DC
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
## 2022-05-17 NOTE — Patient Instructions (Signed)
Mantenimiento de la salud en los hombres Health Maintenance, Male Adoptar un estilo de vida saludable y recibir atencin preventiva son importantes para promover la salud y el bienestar. Consulte al mdico sobre: El esquema adecuado para hacerse pruebas y exmenes peridicos. Cosas que puede hacer por su cuenta para prevenir enfermedades y mantenerse sano. Qu debo saber sobre la dieta, el peso y el ejercicio? Consuma una dieta saludable  Consuma una dieta que incluya muchas verduras, frutas, productos lcteos con bajo contenido de grasa y protenas magras. No consuma muchos alimentos ricos en grasas slidas, azcares agregados o sodio. Mantenga un peso saludable El ndice de masa muscular (IMC) es una medida que puede utilizarse para identificar posibles problemas de peso. Proporciona una estimacin de la grasa corporal basndose en el peso y la altura. Su mdico puede ayudarle a determinar su IMC y a lograr o mantener un peso saludable. Haga ejercicio con regularidad Haga ejercicio con regularidad. Esta es una de las prcticas ms importantes que puede hacer por su salud. La mayora de los adultos deben seguir estas pautas: Realizar, al menos, 150 minutos de actividad fsica por semana. El ejercicio debe aumentar la frecuencia cardaca y hacerlo transpirar (ejercicio de intensidad moderada). Hacer ejercicios de fortalecimiento por lo menos dos veces por semana. Agregue esto a su plan de ejercicio de intensidad moderada. Pase menos tiempo sentado. Incluso la actividad fsica ligera puede ser beneficiosa. Controle sus niveles de colesterol y lpidos en la sangre Comience a realizarse anlisis de lpidos y colesterol en la sangre a los 20 aos y luego reptalos cada 5 aos. Es posible que necesite controlar los niveles de colesterol con mayor frecuencia si: Sus niveles de lpidos y colesterol son altos. Es mayor de 40 aos. Presenta un alto riesgo de padecer enfermedades cardacas. Qu debo  saber sobre las pruebas de deteccin del cncer? Muchos tipos de cncer pueden detectarse de manera temprana y, a menudo, pueden prevenirse. Segn su historia clnica y sus antecedentes familiares, es posible que deba realizarse pruebas de deteccin del cncer en diferentes edades. Esto puede incluir pruebas de deteccin de lo siguiente: Cncer colorrectal. Cncer de prstata. Cncer de piel. Cncer de pulmn. Qu debo saber sobre la enfermedad cardaca, la diabetes y la hipertensin arterial? Presin arterial y enfermedad cardaca La hipertensin arterial causa enfermedades cardacas y aumenta el riesgo de accidente cerebrovascular. Es ms probable que esto se manifieste en las personas que tienen lecturas de presin arterial alta o tienen sobrepeso. Hable con el mdico sobre sus valores de presin arterial deseados. Hgase controlar la presin arterial: Cada 3 a 5 aos si tiene entre 18 y 39 aos. Todos los aos si es mayor de 40 aos. Si tiene entre 65 y 75 aos y es fumador o sola fumar, pregntele al mdico si debe realizarse una prueba de deteccin de aneurisma artico abdominal (AAA) por nica vez. Diabetes Realcese exmenes de deteccin de la diabetes con regularidad. Este anlisis revisa el nivel de azcar en la sangre en ayunas. Hgase las pruebas de deteccin: Cada tres aos despus de los 45 aos de edad si tiene un peso normal y un bajo riesgo de padecer diabetes. Con ms frecuencia y a partir de una edad inferior si tiene sobrepeso o un alto riesgo de padecer diabetes. Qu debo saber sobre la prevencin de infecciones? Hepatitis B Si tiene un riesgo ms alto de contraer hepatitis B, debe someterse a un examen de deteccin de este virus. Hable con el mdico para averiguar si tiene riesgo de   contraer la infeccin por hepatitis B. Hepatitis C Se recomienda un anlisis de sangre para: Todos los que nacieron entre 1945 y 1965. Todas las personas que tengan un riesgo de haber  contrado hepatitis C. Enfermedades de transmisin sexual (ETS) Debe realizarse pruebas de deteccin de ITS todos los aos, incluidas la gonorrea y la clamidia, si: Es sexualmente activo y es menor de 24 aos. Es mayor de 24 aos, y el mdico le informa que corre riesgo de tener este tipo de infecciones. La actividad sexual ha cambiado desde que le hicieron la ltima prueba de deteccin y tiene un riesgo mayor de tener clamidia o gonorrea. Pregntele al mdico si usted tiene riesgo. Pregntele al mdico si usted tiene un alto riesgo de contraer VIH. El mdico tambin puede recomendarle un medicamento recetado para ayudar a evitar la infeccin por el VIH. Si elige tomar medicamentos para prevenir el VIH, primero debe hacerse los anlisis de deteccin del VIH. Luego debe hacerse anlisis cada 3 meses mientras est tomando los medicamentos. Siga estas indicaciones en su casa: Consumo de alcohol No beba alcohol si el mdico se lo prohbe. Si bebe alcohol: Limite la cantidad que consume de 0 a 2 bebidas por da. Sepa cunta cantidad de alcohol hay en las bebidas que toma. En los [place], una medida equivale a una botella de cerveza de 12 oz (355 ml), un vaso de vino de 5 oz (148 ml) o un vaso de una bebida alcohlica de alta graduacin de 1 oz (44 ml). Estilo de vida No consuma ningn producto que contenga nicotina o tabaco. Estos productos incluyen cigarrillos, tabaco para mascar y aparatos de vapeo, como los cigarrillos electrnicos. Si necesita ayuda para dejar de consumir estos productos, consulte al mdico. No consuma drogas. No comparta agujas. Solicite ayuda a su mdico si necesita apoyo o informacin para abandonar las drogas. Indicaciones generales Realcese los estudios de rutina de la salud, dentales y de la vista. Mantngase al da con las vacunas. Infrmele a su mdico si: Se siente deprimido con frecuencia. Alguna vez ha sido vctima de maltrato o no se siente seguro en su  casa. Resumen Adoptar un estilo de vida saludable y recibir atencin preventiva son importantes para promover la salud y el bienestar. Siga las instrucciones del mdico acerca de una dieta saludable, el ejercicio y la realizacin de pruebas o exmenes para detectar enfermedades. Siga las instrucciones del mdico con respecto al control del colesterol y la presin arterial. Esta informacin no tiene como fin reemplazar el consejo del mdico. Asegrese de hacerle al mdico cualquier pregunta que tenga. Document Revised: 02/15/2021 Document Reviewed: 02/15/2021 Elsevier Patient Education  2023 [employer] Inc.  

## 2022-05-18 ENCOUNTER — Other Ambulatory Visit: Payer: Self-pay | Admitting: Emergency Medicine

## 2022-05-18 LAB — HEPATITIS C ANTIBODY: Hepatitis C Ab: NONREACTIVE

## 2022-08-23 ENCOUNTER — Ambulatory Visit: Payer: BC Managed Care – PPO

## 2022-08-24 ENCOUNTER — Ambulatory Visit (INDEPENDENT_AMBULATORY_CARE_PROVIDER_SITE_OTHER): Payer: BC Managed Care – PPO | Admitting: *Deleted

## 2022-08-24 DIAGNOSIS — Z23 Encounter for immunization: Secondary | ICD-10-CM | POA: Diagnosis not present

## 2022-08-24 NOTE — Progress Notes (Signed)
Pls cosign for ZOSTAVAX inj../lmb

## 2023-05-30 ENCOUNTER — Ambulatory Visit: Payer: BC Managed Care – PPO | Admitting: Emergency Medicine

## 2023-05-30 ENCOUNTER — Encounter: Payer: Self-pay | Admitting: Emergency Medicine

## 2023-05-30 VITALS — BP 108/80 | HR 70 | Temp 98.4°F | Wt 177.4 lb

## 2023-05-30 DIAGNOSIS — T5991XA Toxic effect of unspecified gases, fumes and vapors, accidental (unintentional), initial encounter: Secondary | ICD-10-CM

## 2023-05-30 DIAGNOSIS — Z1329 Encounter for screening for other suspected endocrine disorder: Secondary | ICD-10-CM

## 2023-05-30 DIAGNOSIS — Z0001 Encounter for general adult medical examination with abnormal findings: Secondary | ICD-10-CM

## 2023-05-30 DIAGNOSIS — M5432 Sciatica, left side: Secondary | ICD-10-CM | POA: Insufficient documentation

## 2023-05-30 DIAGNOSIS — Z13228 Encounter for screening for other metabolic disorders: Secondary | ICD-10-CM | POA: Diagnosis not present

## 2023-05-30 DIAGNOSIS — Z130 Encounter for screening for diseases of the blood and blood-forming organs and certain disorders involving the immune mechanism: Secondary | ICD-10-CM | POA: Diagnosis not present

## 2023-05-30 DIAGNOSIS — Z13220 Encounter for screening for lipoid disorders: Secondary | ICD-10-CM | POA: Diagnosis not present

## 2023-05-30 DIAGNOSIS — Z125 Encounter for screening for malignant neoplasm of prostate: Secondary | ICD-10-CM

## 2023-05-30 DIAGNOSIS — I83812 Varicose veins of left lower extremities with pain: Secondary | ICD-10-CM | POA: Insufficient documentation

## 2023-05-30 DIAGNOSIS — T5994XA Toxic effect of unspecified gases, fumes and vapors, undetermined, initial encounter: Secondary | ICD-10-CM | POA: Insufficient documentation

## 2023-05-30 DIAGNOSIS — Z0000 Encounter for general adult medical examination without abnormal findings: Secondary | ICD-10-CM

## 2023-05-30 LAB — COMPREHENSIVE METABOLIC PANEL
ALT: 74 U/L — ABNORMAL HIGH (ref 0–53)
AST: 43 U/L — ABNORMAL HIGH (ref 0–37)
Albumin: 4.2 g/dL (ref 3.5–5.2)
Alkaline Phosphatase: 97 U/L (ref 39–117)
BUN: 15 mg/dL (ref 6–23)
CO2: 29 meq/L (ref 19–32)
Calcium: 9.5 mg/dL (ref 8.4–10.5)
Chloride: 102 meq/L (ref 96–112)
Creatinine, Ser: 1.07 mg/dL (ref 0.40–1.50)
GFR: 77.17 mL/min (ref >60.00)
Glucose, Bld: 98 mg/dL (ref 70–99)
Potassium: 4.6 meq/L (ref 3.5–5.1)
Sodium: 140 meq/L (ref 135–145)
Total Bilirubin: 0.6 mg/dL (ref 0.2–1.2)
Total Protein: 7.4 g/dL (ref 6.0–8.3)

## 2023-05-30 LAB — CBC WITH DIFFERENTIAL/PLATELET
Basophils Absolute: 0 10*3/uL (ref 0.0–0.1)
Basophils Relative: 0.5 % (ref 0.0–3.0)
Eosinophils Absolute: 0.1 10*3/uL (ref 0.0–0.7)
Eosinophils Relative: 1.5 % (ref 0.0–5.0)
HCT: 49.9 % (ref 39.0–52.0)
Hemoglobin: 16.6 g/dL (ref 13.0–17.0)
Lymphocytes Relative: 27.6 % (ref 12.0–46.0)
Lymphs Abs: 1.9 10*3/uL (ref 0.7–4.0)
MCHC: 33.2 g/dL (ref 30.0–36.0)
MCV: 97.5 fl (ref 78.0–100.0)
Monocytes Absolute: 0.7 10*3/uL (ref 0.1–1.0)
Monocytes Relative: 10 % (ref 3.0–12.0)
Neutro Abs: 4.1 10*3/uL (ref 1.4–7.7)
Neutrophils Relative %: 60.4 % (ref 43.0–77.0)
Platelets: 225 10*3/uL (ref 150.0–400.0)
RBC: 5.12 Mil/uL (ref 4.22–5.81)
RDW: 12.6 % (ref 11.5–15.5)
WBC: 6.7 10*3/uL (ref 4.0–10.5)

## 2023-05-30 LAB — LIPID PANEL
Cholesterol: 141 mg/dL (ref 0–200)
HDL: 38.3 mg/dL — ABNORMAL LOW (ref >39.00)
LDL Cholesterol: 72 mg/dL (ref 0–99)
NonHDL: 102.48
Total CHOL/HDL Ratio: 4
Triglycerides: 151 mg/dL — ABNORMAL HIGH (ref 0.0–149.0)
VLDL: 30.2 mg/dL (ref 0.0–40.0)

## 2023-05-30 LAB — PSA: PSA: 0.4 ng/mL (ref 0.10–4.00)

## 2023-05-30 LAB — HEMOGLOBIN A1C: Hgb A1c MFr Bld: 5.4 % (ref 4.6–6.5)

## 2023-05-30 NOTE — Patient Instructions (Signed)
Mantenimiento de Research officer, political party, Male Adoptar un estilo de vida saludable y recibir atencin preventiva son importantes para promover la salud y Counsellor. Consulte al mdico sobre: El esquema adecuado para hacerse pruebas y exmenes peridicos. Cosas que puede hacer por su cuenta para prevenir enfermedades y [place] sano. Qu debo saber sobre la dieta, el peso y el ejercicio? Consuma una dieta saludable  Consuma una dieta que incluya muchas verduras, frutas, productos lcteos con bajo contenido de [place] y Associate Professor. No consuma muchos alimentos ricos en grasas slidas, azcares agregados o sodio. Mantenga un peso saludable El ndice de masa muscular [facility]) es una medida que puede utilizarse para identificar posibles problemas de [place]. Proporciona una estimacin de la grasa corporal basndose en el peso y la altura. Su mdico puede ayudarle a Engineer, site IMC y a Personnel officer o Pharmacologist un peso saludable. Haga ejercicio con regularidad Haga ejercicio con regularidad. Esta es una de las prcticas ms importantes que puede hacer por su salud. La [employer] de los adultos deben seguir estas pautas: Education officer, environmental, al menos, 150 minutos de actividad fsica por semana. El ejercicio debe aumentar la frecuencia cardaca y Media planner transpirar (ejercicio de intensidad moderada). Hacer ejercicios de fortalecimiento por lo [facility] por semana. Agregue esto a su plan de ejercicio de intensidad moderada. Pase menos tiempo sentado. Incluso la actividad fsica ligera puede ser beneficiosa. Controle sus niveles de colesterol y lpidos en la sangre Comience a realizarse anlisis de lpidos y Oncologist en la sangre a los 20 aos y luego reptalos cada 5 aos. Es posible que Insurance underwriter los niveles de colesterol con mayor frecuencia si: Sus niveles de lpidos y colesterol son altos. Es mayor de 40 aos. Presenta un alto riesgo de padecer enfermedades cardacas. Qu debo  saber sobre las pruebas de deteccin del cncer? Muchos tipos de cncer pueden detectarse de manera temprana y, a menudo, pueden prevenirse. Segn su historia clnica y sus antecedentes familiares, es posible que deba realizarse pruebas de deteccin del cncer en diferentes edades. Esto puede incluir pruebas de deteccin de lo siguiente: Building services engineer. Cncer de prstata. Cncer de piel. Cncer de pulmn. Qu debo saber sobre la enfermedad cardaca, la diabetes y la hipertensin arterial? Presin arterial y enfermedad cardaca La hipertensin arterial causa enfermedades cardacas y [place] el riesgo de accidente cerebrovascular. Es ms probable que esto se manifieste en las personas que tienen lecturas de presin arterial alta o tienen sobrepeso. Hable con el mdico sobre sus valores de presin arterial deseados. Hgase controlar la presin arterial: Cada 3 a 5 aos si tiene entre 18 y 30 aos. Todos los aos si es mayor de 40 aos. Si tiene entre 65 y 65 aos y es fumador o Insurance underwriter, pregntele al mdico si debe realizarse una prueba de deteccin de aneurisma artico abdominal (AAA) por nica vez. Diabetes Realcese exmenes de deteccin de la diabetes con regularidad. Este anlisis revisa el nivel de azcar en la sangre en [place]. Hgase las pruebas de deteccin: Cada tres aos despus de los 45 aos de edad si tiene un peso normal y un bajo riesgo de padecer diabetes. Con ms frecuencia y a partir de [place] edad inferior si tiene sobrepeso o un alto riesgo de padecer diabetes. Qu debo saber sobre la prevencin de infecciones? Hepatitis B Si tiene un riesgo ms alto de contraer hepatitis B, debe someterse a un examen de deteccin de este virus. Hable con el mdico para averiguar si tiene riesgo de  contraer la infeccin por hepatitis B. Hepatitis C Se recomienda un anlisis de [place] para: Todos los que nacieron entre 1945 y [phone]. Todas las personas que tengan un riesgo de haber  contrado hepatitis C. Enfermedades de transmisin sexual (ETS) Debe realizarse pruebas de deteccin de ITS todos los aos, incluidas la gonorrea y la clamidia, si: Es sexualmente activo y es menor de [address]. Es mayor de [address], y Public affairs consultant informa que corre riesgo de tener este tipo de infecciones. La actividad sexual ha cambiado desde que le hicieron la ltima prueba de deteccin y tiene un riesgo mayor de Warehouse manager clamidia o Copy. Pregntele al mdico si usted tiene riesgo. Pregntele al mdico si usted tiene un alto riesgo de Primary school teacher VIH. El mdico tambin puede recomendarle un medicamento recetado para ayudar a evitar la infeccin por el VIH. Si elige tomar medicamentos para prevenir el VIH, primero debe [employer] de deteccin del VIH. Luego debe hacerse anlisis cada 3 meses mientras est tomando los medicamentos. Siga estas indicaciones en su casa: Consumo de alcohol No beba alcohol si el mdico se lo prohbe. Si bebe alcohol: Limite la cantidad que consume de 0 a 2 bebidas por da. Sepa cunta cantidad de alcohol hay en las bebidas que toma. En los [address], una medida equivale a una botella de cerveza de 12 oz (355 ml), un vaso de vino de 5 oz (148 ml) o un vaso de una bebida alcohlica de alta graduacin de 1 oz (44 ml). Estilo de vida No consuma ningn producto que contenga nicotina o tabaco. Estos productos incluyen cigarrillos, tabaco para Theatre manager y aparatos de vapeo, como los Administrator, Civil Service. Si necesita ayuda para dejar de consumir estos productos, consulte al mdico. No consuma drogas. No comparta agujas. Solicite ayuda a su mdico si necesita apoyo o informacin para abandonar las drogas. Indicaciones generales Realcese los estudios de rutina de [address], dentales y de Wellsite geologist. Mantngase al da con las vacunas. Infrmele a su mdico si: Se siente deprimido con frecuencia. Alguna vez ha sido vctima de [place] o no se siente seguro en su  casa. Resumen Adoptar un estilo de vida saludable y recibir atencin preventiva son importantes para promover la salud y Counsellor. Siga las instrucciones del mdico acerca de una dieta saludable, el ejercicio y la realizacin de pruebas o exmenes para Hotel manager. Siga las instrucciones del mdico con respecto al control del colesterol y la presin arterial. Esta informacin no tiene Theme park manager el consejo del mdico. Asegrese de hacerle al mdico cualquier pregunta que tenga. Document Revised: 02/15/2021 Document Reviewed: 02/15/2021 Elsevier Patient Education  2024 [employer].

## 2023-05-30 NOTE — Assessment & Plan Note (Signed)
Clinically stable.  No complications. Slowly getting better.  No wheezing or signs of infection today. No red flag signs or symptoms. Running its course.

## 2023-05-30 NOTE — Assessment & Plan Note (Signed)
No signs of phlebitis today. Management discussed.
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
## 2023-05-30 NOTE — Assessment & Plan Note (Signed)
Intermittent chronic pain.  No complications. Most likely related to activities of daily life Pain management discussed

## 2023-06-21 ENCOUNTER — Other Ambulatory Visit: Payer: Self-pay | Admitting: Emergency Medicine

## 2023-06-21 ENCOUNTER — Encounter: Payer: Self-pay | Admitting: Internal Medicine

## 2023-06-21 ENCOUNTER — Ambulatory Visit: Payer: BC Managed Care – PPO | Admitting: Internal Medicine

## 2023-06-21 VITALS — BP 122/78 | HR 60 | Temp 98.6°F | Ht 67.0 in | Wt 183.0 lb

## 2023-06-21 DIAGNOSIS — J3489 Other specified disorders of nose and nasal sinuses: Secondary | ICD-10-CM

## 2023-06-21 DIAGNOSIS — E785 Hyperlipidemia, unspecified: Secondary | ICD-10-CM

## 2023-06-21 DIAGNOSIS — R21 Rash and other nonspecific skin eruption: Secondary | ICD-10-CM | POA: Diagnosis not present

## 2023-06-21 DIAGNOSIS — T7840XA Allergy, unspecified, initial encounter: Secondary | ICD-10-CM | POA: Diagnosis not present

## 2023-06-21 DIAGNOSIS — L03211 Cellulitis of face: Secondary | ICD-10-CM | POA: Diagnosis not present

## 2023-06-21 MED ORDER — MUPIROCIN 2 % EX OINT: 1.0000 | TOPICAL_OINTMENT | Freq: Two times a day (BID) | CUTANEOUS | 0 refills | Status: AC

## 2023-06-21 MED ORDER — DOXYCYCLINE HYCLATE 100 MG PO TABS: 100.0000 mg | ORAL_TABLET | Freq: Two times a day (BID) | ORAL | 0 refills | Status: DC

## 2023-06-21 MED ORDER — PREDNISONE 10 MG PO TABS: ORAL_TABLET | ORAL | 0 refills | Status: DC

## 2023-06-21 NOTE — Patient Instructions (Signed)
Please take all new medication as prescribed - the pill antibiotic, the antibiotic ointment for the nose, and the prednisone for the rash  Please continue all other medications as before, and refills have been done if requested.  Please have the pharmacy call with any other refills you may need.  Please keep your appointments with your specialists as you may have planned  Please see Dr Alvy Bimler next wk if not improving.

## 2023-06-21 NOTE — Progress Notes (Unsigned)
Patient ID: Catlin Aycock, male   DOB: [birth year], 57 y.o.   MRN: [id number]        Chief Complaint: follow up nasal pain, facial pain, rash, hld       HPI:  Shalin Vonbargen is a 57 y.o. male here overall ok except for 3 days onset worsening bilateral nares and facies pain, red, swelling, including some upper lip as well, with the beginning of a slight raised nodule like lesion to the right mid face; has felt warm, no chills. Pt denies chest pain, increased sob or doe, wheezing, orthopnea, PND, increased LE swelling, palpitations, dizziness or syncope.   Pt denies polydipsia, polyuria, or new focal neuro s/s.   Also has markedly itchy rash as well in the past few days as well for unclear reason including the upper chest, upper back and neck, and some areas of the arms and left lower leg as well.        Wt Readings from Last 3 Encounters:  06/21/23 183 lb (83 kg)  05/30/23 177 lb 6.4 oz (80.5 kg)  05/17/22 190 lb 4 oz (86.3 kg)   BP Readings from Last 3 Encounters:  06/21/23 122/78  05/30/23 108/80  05/17/22 110/72         Past Medical History:  Diagnosis Date   Anxiety    Past Surgical History:  Procedure Laterality Date   ANAL FISTULOTOMY N/A 10/23/2019   Procedure: ANAL FISTULOTOMY, EXCISION OF LEFT POSTERIOR AND LEFT LATERAL PERIANAL SKIN NODULES;  Surgeon: Andria Meuse, MD;  Location: [facility];  Service: General;  Laterality: N/A;   APPENDECTOMY     HERNIA REPAIR     KNEE ARTHROSCOPY W/ MENISCAL REPAIR Bilateral    RIH     left done also    reports that he has never smoked. He has never used smokeless tobacco. He reports that he does not currently use alcohol. He reports that he does not use drugs. family history includes Alcohol abuse in his brother; Alzheimer's disease (age of onset: 39) in his father; Heart Problems in his brother; Heart disease in his sister; Other in his brother; Ovarian cancer in his mother; Thyroid cancer in his sister. No Known  Allergies Current Outpatient Medications on File Prior to Visit  Medication Sig Dispense Refill   acetaminophen (TYLENOL) 500 MG tablet Take 2 tablets (1,000 mg total) by mouth every 6 (six) hours as needed. DO NOT TAKE MORE THAN 8 TABS IN A 24 HOUR PERIOD 30 tablet 0   GLUCOSAMINE-CHONDROITIN PO Take 1 tablet by mouth daily.     No current facility-administered medications on file prior to visit.        ROS:  All others reviewed and negative.  Objective        PE:  BP 122/78 (BP Location: Right Arm, Patient Position: Sitting, Cuff Size: Normal)   Pulse 60   Temp 98.6 F (37 C) (Oral)   Ht 5\' 7"  (1.702 m)   Wt 183 lb (83 kg)   SpO2 98%   BMI 28.66 kg/m                 Constitutional: Pt appears in NAD               HENT: Head: NCAT.                Right Ear: External ear normal.  Left Ear: External ear normal.                Eyes: . Pupils are equal, round, and reactive to light. Conjunctivae and EOM are normal; nose with mod tender swelling bilateral nares                              Neck: Neck supple. Gross normal ROM               Cardiovascular: Normal rate and regular rhythm.                 Pulmonary/Chest: Effort normal and breath sounds without rales or wheezing.                Abd:  Soft, NT, ND, + BS, no organomegaly               Neurological: Pt is alert. At baseline orientation, motor grossly intact               Skin: Skin is warm.  LE edema - none, non tender red lesions to arms and bilateral facies               Psychiatric: Pt behavior is normal without agitation   Micro: none  Cardiac tracings I have personally interpreted today:  none  Pertinent Radiological findings (summarize): none    Assessment/Plan:  Jamin Panther is a 57 y.o. Other or two or more races [6] male with  has a past medical history of Anxiety.  Facial cellulitis Mild to mod, for antibx course doxycycline 100 bid,  to f/u any worsening symptoms or concerns  Rash  due to allergy Mild to mod, for prednisone taper,  to f/u any worsening symptoms or concerns  Nasal pain Can't r/p intranasal infection as well - for mupirocin oint bid x 5 days,  to f/u any worsening symptoms or concerns  Followup: Return if symptoms worsen or fail to improve.  Oliver Barre, MD 06/23/2023 2:19 PM [facility]

## 2023-06-23 ENCOUNTER — Encounter: Payer: Self-pay | Admitting: Internal Medicine

## 2023-06-23 DIAGNOSIS — J3489 Other specified disorders of nose and nasal sinuses: Secondary | ICD-10-CM | POA: Insufficient documentation

## 2023-06-23 NOTE — Assessment & Plan Note (Signed)
Mild to mod, for antibx course doxycycline 100 bid,  to f/u any worsening symptoms or concerns 

## 2023-06-23 NOTE — Assessment & Plan Note (Signed)
Mild to mod, for prednisone taper, to f/u any worsening symptoms or concerns 

## 2023-06-23 NOTE — Assessment & Plan Note (Signed)
Can't r/p intranasal infection as well - for mupirocin oint bid x 5 days,  to f/u any worsening symptoms or concerns

## 2023-07-02 ENCOUNTER — Other Ambulatory Visit: Payer: Self-pay | Admitting: Internal Medicine

## 2023-10-11 ENCOUNTER — Telehealth: Payer: Self-pay | Admitting: Emergency Medicine

## 2023-10-11 NOTE — Telephone Encounter (Signed)
Pt was seen on 9.27.24 for facial pain, redness, and swelling. States this has not improved, and would like a referral to see a dermatologist.  Please let me know if we need to schedule the pt.

## 2023-10-14 ENCOUNTER — Other Ambulatory Visit: Payer: Self-pay | Admitting: Radiology

## 2023-10-14 DIAGNOSIS — L03211 Cellulitis of face: Secondary | ICD-10-CM

## 2023-10-14 NOTE — Telephone Encounter (Signed)
Referral placed.

## 2023-12-10 ENCOUNTER — Encounter: Payer: Self-pay | Admitting: Emergency Medicine

## 2023-12-10 ENCOUNTER — Ambulatory Visit: Admitting: Emergency Medicine

## 2023-12-10 VITALS — BP 116/86 | HR 63 | Temp 98.3°F | Ht 67.0 in | Wt 190.0 lb

## 2023-12-10 DIAGNOSIS — L219 Seborrheic dermatitis, unspecified: Secondary | ICD-10-CM | POA: Diagnosis not present

## 2023-12-10 DIAGNOSIS — R21 Rash and other nonspecific skin eruption: Secondary | ICD-10-CM | POA: Diagnosis not present

## 2023-12-10 MED ORDER — KETOCONAZOLE 2 % EX CREA
1.0000 | TOPICAL_CREAM | Freq: Two times a day (BID) | CUTANEOUS | 2 refills | Status: AC
Start: 2023-12-10 — End: ?

## 2023-12-10 MED ORDER — DOXYCYCLINE HYCLATE 100 MG PO TABS
100.0000 mg | ORAL_TABLET | Freq: Two times a day (BID) | ORAL | 0 refills | Status: AC
Start: 2023-12-10 — End: 2023-12-17

## 2023-12-10 NOTE — Assessment & Plan Note (Signed)
 Differential diagnosis discussed including possibility of facial cellulitis. Recommend to start doxycycline 100 mg twice a day for 7 days Recommend dermatology evaluation Referral placed today

## 2023-12-10 NOTE — Progress Notes (Signed)
 [name] 58 y.o.   Chief Complaint  Patient presents with   Sinusitis    Patient states he's been feeling the sinus issues since the last time he seen Dr. Jonny Cook  06/21/23. Patient states sometimes its really bad. States he was given a cream that helped with the dryness on and around his nose. Patient has fever on and off. Pt says he has an infection in his tooth he's going back to see the dentist 3/27. Derma referral was placed 1/20 will f/u with referral team    HISTORY OF PRESENT ILLNESS: This is a 58 y.o. male complaining of facial rash on and off for the past several months. No other associated symptoms. No other complaints or medical concerns today.  Sinusitis Pertinent negatives include no chills, congestion, coughing, headaches, shortness of breath or sore throat.     Prior to Admission medications   Medication Sig Start Date End Date Taking? Authorizing Provider  acetaminophen (TYLENOL) 500 MG tablet Take 2 tablets (1,000 mg total) by mouth every 6 (six) hours as needed. DO NOT TAKE MORE THAN 8 TABS IN A 24 HOUR PERIOD 12/24/19  Yes Hall-Potvin, [place], PA-C  doxycycline (VIBRA-TABS) 100 MG tablet Take 1 tablet (100 mg total) by mouth 2 (two) times daily for 7 days. 12/10/23 12/17/23 Yes Anthony Stay, Anthony Kempf, MD  GLUCOSAMINE-CHONDROITIN PO Take 1 tablet by mouth daily.   Yes [provider]  ketoconazole (NIZORAL) 2 % cream Apply 1 Application topically 2 (two) times daily. 12/10/23  Yes Anthony Dingley, Anthony Kempf, MD  rosuvastatin (CRESTOR) 10 MG tablet TAKE 1 TABLET(10 MG) BY MOUTH DAILY 06/21/23  Yes Anthony Martis, Anthony Kempf, MD  predniSONE (DELTASONE) 10 MG tablet 3 tabs by mouth per day for 3 days,2tabs per day for 3 days,1tab per day for 3 days Patient not taking: Reported on 12/10/2023 06/21/23   Anthony Levins, MD    No Known Allergies  Patient Active Problem List   Diagnosis Date Noted   Varicose veins of left lower extremity with pain 05/30/2023   Elevated  cholesterol 09/07/2016    Past Medical History:  Diagnosis Date   Anxiety     Past Surgical History:  Procedure Laterality Date   ANAL FISTULOTOMY N/A 10/23/2019   Procedure: ANAL FISTULOTOMY, EXCISION OF LEFT POSTERIOR AND LEFT LATERAL PERIANAL SKIN NODULES;  Surgeon: Andria Meuse, MD;  Location: [facility];  Service: General;  Laterality: N/A;   APPENDECTOMY     HERNIA REPAIR     KNEE ARTHROSCOPY W/ MENISCAL REPAIR Bilateral    RIH     left done also    Social History   Socioeconomic History   Marital status: Single    Spouse name: Not on file   Number of children: 0   Years of education: Not on file   Highest education level: Not on file  Occupational History   Not on file  Tobacco Use   Smoking status: Never   Smokeless tobacco: Never  Vaping Use   Vaping status: Never Used  Substance and Sexual Activity   Alcohol use: Not Currently   Drug use: No   Sexual activity: Never    Birth control/protection: Abstinence  Other Topics Concern   Not on file  Social History Narrative   Not on file   Social Drivers of Health   Financial Resource Strain: Not on file  Food Insecurity: Not on file  Transportation Needs: Not on file  Physical Activity: Not on file  Stress:  Not on file  Social Connections: Not on file  Intimate Partner Violence: Not on file    Family History  Problem Relation Age of Onset   Ovarian cancer Mother    Alzheimer's disease Father 45   Heart disease Sister    Other Brother        Malaria   Heart Problems Brother    Alcohol abuse Brother    Thyroid cancer Sister      Review of Systems  Constitutional:  Negative for chills and fever.  HENT: Negative.  Negative for congestion and sore throat.   Respiratory: Negative.  Negative for cough and shortness of breath.   Cardiovascular: Negative.  Negative for chest pain and palpitations.  Gastrointestinal:  Negative for abdominal pain, diarrhea, nausea and vomiting.   Genitourinary:  Negative for dysuria and hematuria.  Skin:  Positive for itching and rash.  Neurological:  Negative for dizziness and headaches.    Vitals:   12/10/23 0809  BP: 116/86  Pulse: 63  Temp: 98.3 F (36.8 C)  SpO2: 99%    Physical Exam Constitutional:      Appearance: Normal appearance.  HENT:     Head: Normocephalic.     Mouth/Throat:     Mouth: Mucous membranes are moist.     Pharynx: Oropharynx is clear.  Eyes:     Extraocular Movements: Extraocular movements intact.     Conjunctiva/sclera: Conjunctivae normal.     Pupils: Pupils are equal, round, and reactive to light.  Cardiovascular:     Rate and Rhythm: Normal rate.  Pulmonary:     Effort: Pulmonary effort is normal.  Musculoskeletal:        General: No swelling, tenderness or deformity.  Skin:    General: Skin is warm and dry.     Findings: Rash (Facial rash) present.  Neurological:     Mental Status: He is alert and oriented to person, place, and time.  Psychiatric:        Mood and Affect: Mood normal.        Behavior: Behavior normal.      ASSESSMENT & PLAN: A total of 32 minutes was spent with the patient and counseling/coordination of care regarding preparing for this visit, review of most recent office visit notes, review of all medications, differential diagnosis of facial rash and possibility of seborrheic dermatitis, management, need for dermatology evaluation, prognosis, documentation, and need for follow-up if no better or worse during the next several weeks.  Problem List Items Addressed This Visit       Musculoskeletal and Integument   Seborrheic dermatitis - Primary   Most likely diagnosis. Chronic condition with frequent flareups Recommend dermatology evaluation Referral placed today Recommend ketoconazole cream twice a day      Relevant Medications   ketoconazole (NIZORAL) 2 % cream   doxycycline (VIBRA-TABS) 100 MG tablet   Other Relevant Orders   Ambulatory referral  to Dermatology   Facial rash   Differential diagnosis discussed including possibility of facial cellulitis. Recommend to start doxycycline 100 mg twice a day for 7 days Recommend dermatology evaluation Referral placed today      Relevant Medications   ketoconazole (NIZORAL) 2 % cream   doxycycline (VIBRA-TABS) 100 MG tablet   Other Relevant Orders   Ambulatory referral to Dermatology   Patient Instructions  Dermatitis seborreica en los adultos Seborrheic Dermatitis, Adult La dermatitis seborreica es una enfermedad cutnea que causa manchas rojas y escamosas. Suele aparecer en el cuero cabelludo, donde puede  denominarse "caspa". Las manchas tambin pueden aparecer en otras partes del cuerpo. Las [employer] en la piel tienden a Research officer, trade union donde hay muchas glndulas sebceas. Las zonas del cuerpo que pueden verse afectadas incluyen las siguientes: El cuero cabelludo. La cara, las cejas y los odos. La zona alrededor [employer]. Los pliegues de la piel. Estos [employer], la ingle y las nalgas. El pecho. La afeccin suele ser de larga duracin (crnica). Puede aparecer y desaparecer sin motivo conocido. Puede activarse por un factor desencadenante, por ejemplo: El clima fro. La exposicin al sol. Estrs. El consumo de alcohol. Cules son las causas? Se desconoce la causa de esta afeccin. Puede estar relacionada con un exceso de hongos en la piel o cambios en el funcionamiento del sistema que combate las enfermedades del cuerpo (sistema inmunitario). Qu incrementa el riesgo? Hay ms probabilidad de que tenga esta afeccin si: Tiene un sistema inmunitario dbil. Tiene 50 aos o ms. Tiene otras afecciones, como: Virus de inmunodeficiencia humana (VIH) o sndrome de inmunodeficiencia adquirida (sida). Enfermedad de Parkinson. Trastornos del [place] de nimo, como depresin. Problemas hepticos. Obesidad. Cules son los signos o sntomas? Los sntomas de esta afeccin  incluyen: Escamas gruesas en el cuero cabelludo. Enrojecimiento en el rostro o en las [place]. Piel escamosa. Las [employer] ser de color blanco o [place]. Piel que parece ser grasa o seca pero que no mejora con cremas hidratantes. Picazn o [employer] zonas afectadas. Cmo se diagnostica? Esta afeccin se diagnostica mediante una revisin de los antecedentes mdicos y un examen fsico. Se pueden hacer estudios de una muestra de piel (biopsia de piel). Tal vez haya que consultar a un especialista en piel (dermatlogo). Cmo se trata? No hay cura para esta afeccin, pero el tratamiento puede ayudar a [employer] sntomas. Puede recibir tratamiento para [employer], reducir el riesgo de infecciones cutneas y reducir la hinchazn o la picazn. El tratamiento puede incluir: Champs con medicamentos, cremas humectantes o ungentos. Cremas para eliminar los hongos en la piel. Cremas para reducir la hinchazn y la irritacin (corticoesteroides). Siga estas instrucciones en su casa: Cuidado de la piel Aplquese champ con medicamentos, cremas o ungentos como se lo haya indicado el mdico. No use productos para la piel que contengan alcohol. Tome duchas o baos de inmersin de agua tibia. Evite el agua muy caliente. Cuando est al [place], use sombrero y vestimenta que bloquee la luz UV. Instrucciones generales Aplquese los medicamentos de venta libre y los recetados solamente como se lo haya indicado el mdico. Sepa cules son los desencadenantes que causan los sntomas para poder evitarlos. Recurra a tcnicas que reduzcan el estrs, como meditacin o yoga. No beba alcohol si el mdico se lo prohbe. Concurra a todas las visitas de seguimiento. El mdico le examinar la piel para asegurarse de que los tratamientos sean eficaces. Dnde obtener ms informacin Teacher, music of Dermatology (Academia Estadounidense de Dermatologa): MarketingSheets.si Comunquese con un mdico  si: Los sntomas no mejoran con Scientist, research (medical). Sus sntomas empeoran. Aparecen nuevos sntomas. Solicite ayuda de inmediato si: Su afeccin empeora rpidamente, incluso con tratamiento. Esta informacin no tiene Theme park manager el consejo del mdico. Asegrese de hacerle al mdico cualquier pregunta que tenga. Document Revised: 03/02/2022 Document Reviewed: 03/02/2022 Elsevier Patient Education  2024 Elsevier Inc.     Edwina Barth, MD [facility]

## 2023-12-10 NOTE — Assessment & Plan Note (Signed)
 Most likely diagnosis. Chronic condition with frequent flareups Recommend dermatology evaluation Referral placed today Recommend ketoconazole cream twice a day

## 2023-12-10 NOTE — Patient Instructions (Signed)
 Dermatitis seborreica en los adultos Seborrheic Dermatitis, Adult La dermatitis seborreica es una enfermedad cutnea que causa manchas rojas y escamosas. Suele aparecer en el cuero cabelludo, donde puede denominarse "caspa". Las manchas tambin pueden aparecer en otras partes del cuerpo. Las [employer] en la piel tienden a Research officer, trade union donde hay muchas glndulas sebceas. Las zonas del cuerpo que pueden verse afectadas incluyen las siguientes: El cuero cabelludo. La cara, las cejas y los odos. La zona alrededor [employer]. Los pliegues de la piel. Estos [employer], la ingle y las nalgas. El pecho. La afeccin suele ser de larga duracin (crnica). Puede aparecer y desaparecer sin motivo conocido. Puede activarse por un factor desencadenante, por ejemplo: El clima fro. La exposicin al sol. Estrs. El consumo de alcohol. Cules son las causas? Se desconoce la causa de esta afeccin. Puede estar relacionada con un exceso de hongos en la piel o cambios en el funcionamiento del sistema que combate las enfermedades del cuerpo (sistema inmunitario). Qu incrementa el riesgo? Hay ms probabilidad de que tenga esta afeccin si: Tiene un sistema inmunitario dbil. Tiene 50 aos o ms. Tiene otras afecciones, como: Virus de inmunodeficiencia humana (VIH) o sndrome de inmunodeficiencia adquirida (sida). Enfermedad de Parkinson. Trastornos del [place] de nimo, como depresin. Problemas hepticos. Obesidad. Cules son los signos o sntomas? Los sntomas de esta afeccin incluyen: Escamas gruesas en el cuero cabelludo. Enrojecimiento en el rostro o en las [place]. Piel escamosa. Las [employer] ser de color blanco o [place]. Piel que parece ser grasa o seca pero que no mejora con cremas hidratantes. Picazn o [employer] zonas afectadas. Cmo se diagnostica? Esta afeccin se diagnostica mediante una revisin de los antecedentes mdicos y un examen fsico. Se pueden hacer estudios  de una muestra de piel (biopsia de piel). Tal vez haya que consultar a un especialista en piel (dermatlogo). Cmo se trata? No hay cura para esta afeccin, pero el tratamiento puede ayudar a [employer] sntomas. Puede recibir tratamiento para [employer], reducir el riesgo de infecciones cutneas y reducir la hinchazn o la picazn. El tratamiento puede incluir: Champs con medicamentos, cremas humectantes o ungentos. Cremas para eliminar los hongos en la piel. Cremas para reducir la hinchazn y la irritacin (corticoesteroides). Siga estas instrucciones en su casa: Cuidado de la piel Aplquese champ con medicamentos, cremas o ungentos como se lo haya indicado el mdico. No use productos para la piel que contengan alcohol. Tome duchas o baos de inmersin de agua tibia. Evite el agua muy caliente. Cuando est al [place], use sombrero y vestimenta que bloquee la luz UV. Instrucciones generales Aplquese los medicamentos de venta libre y los recetados solamente como se lo haya indicado el mdico. Sepa cules son los desencadenantes que causan los sntomas para poder evitarlos. Recurra a tcnicas que reduzcan el estrs, como meditacin o yoga. No beba alcohol si el mdico se lo prohbe. Concurra a todas las visitas de seguimiento. El mdico le examinar la piel para asegurarse de que los tratamientos sean eficaces. Dnde obtener ms informacin Teacher, music of Dermatology (Academia Estadounidense de Dermatologa): MarketingSheets.si Comunquese con un mdico si: Los sntomas no mejoran con Scientist, research (medical). Sus sntomas empeoran. Aparecen nuevos sntomas. Solicite ayuda de inmediato si: Su afeccin empeora rpidamente, incluso con tratamiento. Esta informacin no tiene Theme park manager el consejo del mdico. Asegrese de hacerle al mdico cualquier pregunta que tenga. Document Revised: 03/02/2022 Document Reviewed: 03/02/2022 Elsevier Patient Education  2024 [employer].

## 2023-12-11 ENCOUNTER — Telehealth: Payer: Self-pay

## 2023-12-11 NOTE — Telephone Encounter (Unsigned)
 Copied from CRM [id number]. Topic: General - Other >> Dec 11, 2023 10:19 AM Denese Killings wrote: Reason for CRM: Patient is requesting a callback from the nurse regarding the visit from yesterday.

## 2023-12-13 NOTE — Telephone Encounter (Signed)
 Returning patients call no answer. LVM for patient to call back.

## 2024-06-04 ENCOUNTER — Encounter: Payer: Self-pay | Admitting: Emergency Medicine

## 2024-06-04 ENCOUNTER — Ambulatory Visit: Payer: BC Managed Care – PPO | Admitting: Emergency Medicine

## 2024-06-04 ENCOUNTER — Ambulatory Visit: Payer: Self-pay | Admitting: Emergency Medicine

## 2024-06-04 VITALS — BP 118/82 | HR 61 | Temp 98.1°F | Ht 67.0 in | Wt 185.0 lb

## 2024-06-04 DIAGNOSIS — Z1329 Encounter for screening for other suspected endocrine disorder: Secondary | ICD-10-CM

## 2024-06-04 DIAGNOSIS — Z13228 Encounter for screening for other metabolic disorders: Secondary | ICD-10-CM

## 2024-06-04 DIAGNOSIS — Z0000 Encounter for general adult medical examination without abnormal findings: Secondary | ICD-10-CM | POA: Diagnosis not present

## 2024-06-04 DIAGNOSIS — Z125 Encounter for screening for malignant neoplasm of prostate: Secondary | ICD-10-CM | POA: Diagnosis not present

## 2024-06-04 DIAGNOSIS — Z13220 Encounter for screening for lipoid disorders: Secondary | ICD-10-CM | POA: Diagnosis not present

## 2024-06-04 DIAGNOSIS — Z130 Encounter for screening for diseases of the blood and blood-forming organs and certain disorders involving the immune mechanism: Secondary | ICD-10-CM

## 2024-06-04 LAB — COMPREHENSIVE METABOLIC PANEL WITH GFR
ALT: 30 U/L (ref 0–53)
AST: 22 U/L (ref 0–37)
Albumin: 4.6 g/dL (ref 3.5–5.2)
Alkaline Phosphatase: 71 U/L (ref 39–117)
BUN: 15 mg/dL (ref 6–23)
CO2: 26 meq/L (ref 19–32)
Calcium: 9.6 mg/dL (ref 8.4–10.5)
Chloride: 103 meq/L (ref 96–112)
Creatinine, Ser: 0.89 mg/dL (ref 0.40–1.50)
GFR: 94.62 mL/min (ref >60.00)
Glucose, Bld: 83 mg/dL (ref 70–99)
Potassium: 4.4 meq/L (ref 3.5–5.1)
Sodium: 139 meq/L (ref 135–145)
Total Bilirubin: 0.7 mg/dL (ref 0.2–1.2)
Total Protein: 7.4 g/dL (ref 6.0–8.3)

## 2024-06-04 LAB — CBC WITH DIFFERENTIAL/PLATELET
Basophils Absolute: 0 K/uL (ref 0.0–0.1)
Basophils Relative: 0.5 % (ref 0.0–3.0)
Eosinophils Absolute: 0.1 K/uL (ref 0.0–0.7)
Eosinophils Relative: 2 % (ref 0.0–5.0)
HCT: 49.2 % (ref 39.0–52.0)
Hemoglobin: 16.7 g/dL (ref 13.0–17.0)
Lymphocytes Relative: 38.2 % (ref 12.0–46.0)
Lymphs Abs: 2.1 K/uL (ref 0.7–4.0)
MCHC: 34 g/dL (ref 30.0–36.0)
MCV: 96.1 fl (ref 78.0–100.0)
Monocytes Absolute: 0.4 K/uL (ref 0.1–1.0)
Monocytes Relative: 7.8 % (ref 3.0–12.0)
Neutro Abs: 2.9 K/uL (ref 1.4–7.7)
Neutrophils Relative %: 51.5 % (ref 43.0–77.0)
Platelets: 249 K/uL (ref 150.0–400.0)
RBC: 5.12 Mil/uL (ref 4.22–5.81)
RDW: 12.3 % (ref 11.5–15.5)
WBC: 5.6 K/uL (ref 4.0–10.5)

## 2024-06-04 LAB — LIPID PANEL
Cholesterol: 138 mg/dL (ref 0–200)
HDL: 37.1 mg/dL — ABNORMAL LOW (ref >39.00)
LDL Cholesterol: 72 mg/dL (ref 0–99)
NonHDL: 101.35
Total CHOL/HDL Ratio: 4
Triglycerides: 149 mg/dL (ref 0.0–149.0)
VLDL: 29.8 mg/dL (ref 0.0–40.0)

## 2024-06-04 LAB — PSA: PSA: 0.49 ng/mL (ref 0.10–4.00)

## 2024-06-04 LAB — HEMOGLOBIN A1C: Hgb A1c MFr Bld: 5.7 % (ref 4.6–6.5)

## 2024-06-04 NOTE — Patient Instructions (Signed)
 Mantenimiento de Research officer, political party, Male Adoptar un estilo de vida saludable y recibir atencin preventiva son importantes para promover la salud y Counsellor. Consulte al mdico sobre: El esquema adecuado para hacerse pruebas y exmenes peridicos. Cosas que puede hacer por su cuenta para prevenir enfermedades y [place] sano. Qu debo saber sobre la dieta, el peso y el ejercicio? Consuma una dieta saludable  Consuma una dieta que incluya muchas verduras, frutas, productos lcteos con bajo contenido de [place] y Associate Professor. No consuma muchos alimentos ricos en grasas slidas, azcares agregados o sodio. Mantenga un peso saludable El ndice de masa muscular [facility]) es una medida que puede utilizarse para identificar posibles problemas de [place]. Proporciona una estimacin de la grasa corporal basndose en el peso y la altura. Su mdico puede ayudarle a Engineer, site IMC y a Personnel officer o Pharmacologist un peso saludable. Haga ejercicio con regularidad Haga ejercicio con regularidad. Esta es una de las prcticas ms importantes que puede hacer por su salud. La [employer] de los adultos deben seguir estas pautas: Education officer, environmental, al menos, 150 minutos de actividad fsica por semana. El ejercicio debe aumentar la frecuencia cardaca y Media planner transpirar (ejercicio de intensidad moderada). Hacer ejercicios de fortalecimiento por lo [employer] por semana. Agregue esto a su plan de ejercicio de intensidad moderada. Pase menos tiempo sentado. Incluso la actividad fsica ligera puede ser beneficiosa. Controle sus niveles de colesterol y lpidos en la sangre Comience a realizarse anlisis de lpidos y Oncologist en la sangre a los 20 aos y luego reptalos cada 5 aos. Es posible que Insurance underwriter los niveles de colesterol con mayor frecuencia si: Sus niveles de lpidos y colesterol son altos. Es mayor de 40 aos. Presenta un alto riesgo de padecer enfermedades cardacas. Qu debo  saber sobre las pruebas de deteccin del cncer? Muchos tipos de cncer pueden detectarse de manera temprana y, a menudo, pueden prevenirse. Segn su historia clnica y sus antecedentes familiares, es posible que deba realizarse pruebas de deteccin del cncer en diferentes edades. Esto puede incluir pruebas de deteccin de lo siguiente: Building services engineer. Cncer de prstata. Cncer de piel. Cncer de pulmn. Qu debo saber sobre la enfermedad cardaca, la diabetes y la hipertensin arterial? Presin arterial y enfermedad cardaca La hipertensin arterial causa enfermedades cardacas y [place] el riesgo de accidente cerebrovascular. Es ms probable que esto se manifieste en las personas que tienen lecturas de presin arterial alta o tienen sobrepeso. Hable con el mdico sobre sus valores de presin arterial deseados. Hgase controlar la presin arterial: Cada 3 a 5 aos si tiene entre 18 y 71 aos. Todos los aos si es mayor de 40 aos. Si tiene entre 65 y 26 aos y es fumador o Insurance underwriter, pregntele al mdico si debe realizarse una prueba de deteccin de aneurisma artico abdominal (AAA) por nica vez. Diabetes Realcese exmenes de deteccin de la diabetes con regularidad. Este anlisis revisa el nivel de azcar en la sangre en [place]. Hgase las pruebas de deteccin: Cada tres aos despus de los 45 aos de edad si tiene un peso normal y un bajo riesgo de padecer diabetes. Con ms frecuencia y a partir de [place] edad inferior si tiene sobrepeso o un alto riesgo de padecer diabetes. Qu debo saber sobre la prevencin de infecciones? Hepatitis B Si tiene un riesgo ms alto de contraer hepatitis B, debe someterse a un examen de deteccin de este virus. Hable con el mdico para averiguar si tiene riesgo de  contraer la infeccin por hepatitis B. Hepatitis C Se recomienda un anlisis de Benjamin Perez para: Todos los que nacieron entre 1945 y [phone]. Todas las personas que tengan un riesgo de haber  contrado hepatitis C. Enfermedades de transmisin sexual (ETS) Debe realizarse pruebas de deteccin de ITS todos los aos, incluidas la gonorrea y la clamidia, si: Es sexualmente activo y es menor de [address]. Es mayor de [address], y Public affairs consultant informa que corre riesgo de tener este tipo de infecciones. La actividad sexual ha cambiado desde que le hicieron la ltima prueba de deteccin y tiene un riesgo mayor de Warehouse manager clamidia o Copy. Pregntele al mdico si usted tiene riesgo. Pregntele al mdico si usted tiene un alto riesgo de Primary school teacher VIH. El mdico tambin puede recomendarle un medicamento recetado para ayudar a evitar la infeccin por el VIH. Si elige tomar medicamentos para prevenir el VIH, primero debe [employer] de deteccin del VIH. Luego debe hacerse anlisis cada 3 meses mientras est tomando los medicamentos. Siga estas indicaciones en su casa: Consumo de alcohol No beba alcohol si el mdico se lo prohbe. Si bebe alcohol: Limite la cantidad que consume de 0 a 2 bebidas por da. Sepa cunta cantidad de alcohol hay en las bebidas que toma. En los [address], una medida equivale a una botella de cerveza de 12 oz (355 ml), un vaso de vino de 5 oz (148 ml) o un vaso de una bebida alcohlica de alta graduacin de 1 oz (44 ml). Estilo de vida No consuma ningn producto que contenga nicotina o tabaco. Estos productos incluyen cigarrillos, tabaco para Theatre manager y aparatos de vapeo, como los Administrator, Civil Service. Si necesita ayuda para dejar de consumir estos productos, consulte al mdico. No consuma drogas. No comparta agujas. Solicite ayuda a su mdico si necesita apoyo o informacin para abandonar las drogas. Indicaciones generales Realcese los estudios de rutina de [address], dentales y de Wellsite geologist. Mantngase al da con las vacunas. Infrmele a su mdico si: Se siente deprimido con frecuencia. Alguna vez ha sido vctima de [place] o no se siente seguro en su  casa. Resumen Adoptar un estilo de vida saludable y recibir atencin preventiva son importantes para promover la salud y Counsellor. Siga las instrucciones del mdico acerca de una dieta saludable, el ejercicio y la realizacin de pruebas o exmenes para Hotel manager. Siga las instrucciones del mdico con respecto al control del colesterol y la presin arterial. Esta informacin no tiene Theme park manager el consejo del mdico. Asegrese de hacerle al mdico cualquier pregunta que tenga. Document Revised: 02/15/2021 Document Reviewed: 02/15/2021 Elsevier Patient Education  2024 [employer].

## 2024-06-04 NOTE — Progress Notes (Signed)
 [name] 58 y.o.   Chief Complaint  Patient presents with   Annual Exam    Patient here for physical. No other concerns    HISTORY OF PRESENT ILLNESS: This is a 58 y.o. male here for annual exam. Has no complaints or medical concerns today. Stress at work.  HPI   Prior to Admission medications   Medication Sig Start Date End Date Taking? Authorizing Provider  acetaminophen  (TYLENOL ) 500 MG tablet Take 2 tablets (1,000 mg total) by mouth every 6 (six) hours as needed. DO NOT TAKE MORE THAN 8 TABS IN A 24 HOUR PERIOD 12/24/19  Yes Hall-Potvin, [place], PA-C  GLUCOSAMINE-CHONDROITIN PO Take 1 tablet by mouth daily.   Yes [provider]  ketoconazole  (NIZORAL ) 2 % cream Apply 1 Application topically 2 (two) times daily. 12/10/23  Yes Lisseth Brazeau, Emil Schanz, MD  RESTASIS 0.05 % ophthalmic emulsion Place 1 drop into both eyes daily. 03/12/24  Yes [provider]  rosuvastatin  (CRESTOR ) 10 MG tablet TAKE 1 TABLET(10 MG) BY MOUTH DAILY 06/21/23  Yes Anadia Helmes, Emil Schanz, MD    No Known Allergies  Patient Active Problem List   Diagnosis Date Noted   Seborrheic dermatitis 12/10/2023   Varicose veins of left lower extremity with pain 05/30/2023   Elevated cholesterol 09/07/2016    Past Medical History:  Diagnosis Date   Anxiety     Past Surgical History:  Procedure Laterality Date   ANAL FISTULOTOMY N/A 10/23/2019   Procedure: ANAL FISTULOTOMY, EXCISION OF LEFT POSTERIOR AND LEFT LATERAL PERIANAL SKIN NODULES;  Surgeon: Teresa Lonni HERO, MD;  Location: [facility];  Service: General;  Laterality: N/A;   APPENDECTOMY     HERNIA REPAIR     KNEE ARTHROSCOPY W/ MENISCAL REPAIR Bilateral    RIH     left done also    Social History   Socioeconomic History   Marital status: Single    Spouse name: Not on file   Number of children: 0   Years of education: Not on file   Highest education level: Not on file  Occupational History   Not on  file  Tobacco Use   Smoking status: Never   Smokeless tobacco: Never  Vaping Use   Vaping status: Never Used  Substance and Sexual Activity   Alcohol use: Not Currently   Drug use: No   Sexual activity: Never    Birth control/protection: Abstinence  Other Topics Concern   Not on file  Social History Narrative   Not on file   Social Drivers of Health   Financial Resource Strain: Not on file  Food Insecurity: Not on file  Transportation Needs: Not on file  Physical Activity: Not on file  Stress: Not on file  Social Connections: Not on file  Intimate Partner Violence: Not on file    Family History  Problem Relation Age of Onset   Ovarian cancer Mother    Alzheimer's disease Father 9   Heart disease Sister    Other Brother        Malaria   Heart Problems Brother    Alcohol abuse Brother    Thyroid cancer Sister      Review of Systems  Constitutional: Negative.  Negative for chills and fever.  HENT: Negative.  Negative for congestion and sore throat.   Respiratory: Negative.  Negative for cough and shortness of breath.   Cardiovascular: Negative.  Negative for chest pain and palpitations.  Gastrointestinal:  Negative for abdominal pain, diarrhea,  nausea and vomiting.  Genitourinary: Negative.  Negative for dysuria and urgency.  Skin: Negative.  Negative for rash.  Neurological: Negative.  Negative for dizziness and headaches.  All other systems reviewed and are negative.   Vitals:   06/04/24 0816  BP: 118/82  Pulse: 61  Temp: 98.1 F (36.7 C)  SpO2: 98%    Physical Exam Vitals reviewed.  Constitutional:      Appearance: Normal appearance.  HENT:     Head: Normocephalic.     Right Ear: Tympanic membrane, ear canal and external ear normal.     Left Ear: Tympanic membrane, ear canal and external ear normal.     Mouth/Throat:     Mouth: Mucous membranes are moist.     Pharynx: Oropharynx is clear.  Eyes:     Extraocular Movements: Extraocular movements  intact.     Pupils: Pupils are equal, round, and reactive to light.  Cardiovascular:     Rate and Rhythm: Normal rate and regular rhythm.     Pulses: Normal pulses.     Heart sounds: Normal heart sounds.  Pulmonary:     Effort: Pulmonary effort is normal.     Breath sounds: Normal breath sounds.  Abdominal:     Palpations: Abdomen is soft.     Tenderness: There is no abdominal tenderness.  Musculoskeletal:     Cervical back: No tenderness.  Lymphadenopathy:     Cervical: No cervical adenopathy.  Skin:    General: Skin is warm and dry.  Neurological:     General: No focal deficit present.     Mental Status: He is alert and oriented to person, place, and time.  Psychiatric:        Mood and Affect: Mood normal.        Behavior: Behavior normal.      ASSESSMENT & PLAN: Problem List Items Addressed This Visit   None Visit Diagnoses       Routine general medical examination at a health care facility    -  Primary   Relevant Orders   Comprehensive metabolic panel with GFR   CBC with Differential/Platelet   Hemoglobin A1c   Lipid panel   PSA     Screening for deficiency anemia       Relevant Orders   CBC with Differential/Platelet     Screening for lipoid disorders       Relevant Orders   Lipid panel     Screening for endocrine, metabolic and immunity disorder       Relevant Orders   Comprehensive metabolic panel with GFR   Hemoglobin A1c     Screening for prostate cancer       Relevant Orders   PSA      Modifiable risk factors discussed with patient. Anticipatory guidance according to age provided. The following topics were also discussed: Social Determinants of Health Smoking.  Non-smoker Diet and nutrition Benefits of exercise Cancer screening and review of colonoscopy report from 2019 Vaccinations review and recommendations Cardiovascular risk assessment and need for blood work The 10-year ASCVD risk score (Arnett DK, et al., 2019) is: 5.5%   Values used  to calculate the score:     Age: 89 years     Clincally relevant sex: Male     Is Non-Hispanic African American: No     Diabetic: No     Tobacco smoker: No     Systolic Blood Pressure: 118 mmHg     Is BP treated: No  HDL Cholesterol: 38.3 mg/dL     Total Cholesterol: 141 mg/dL  Mental health including depression and anxiety.  Stress management at work also discussed.  Toxic work environment affecting his mental health. Fall and accident prevention  Patient Instructions  Mantenimiento de Research officer, political party, Male Adoptar un estilo de vida saludable y recibir atencin preventiva son importantes para promover la salud y Counsellor. Consulte al mdico sobre: El esquema adecuado para hacerse pruebas y exmenes peridicos. Cosas que puede hacer por su cuenta para prevenir enfermedades y [place] sano. Qu debo saber sobre la dieta, el peso y el ejercicio? Consuma una dieta saludable  Consuma una dieta que incluya muchas verduras, frutas, productos lcteos con bajo contenido de grasa y protenas magras. No consuma muchos alimentos ricos en grasas slidas, azcares agregados o sodio. Mantenga un peso saludable El ndice de masa muscular [facility]) es una medida que puede utilizarse para identificar posibles problemas de [place]. Proporciona una estimacin de la grasa corporal basndose en el peso y la altura. Su mdico puede ayudarle a determinar su IMC y a Personnel officer o Pharmacologist un peso saludable. Haga ejercicio con regularidad Haga ejercicio con regularidad. Esta es una de las prcticas ms importantes que puede hacer por su salud. La [employer] de los adultos deben seguir estas pautas: Education officer, environmental, al menos, 150 minutos de actividad fsica por semana. El ejercicio debe aumentar la frecuencia cardaca y Media planner transpirar (ejercicio de intensidad moderada). Hacer ejercicios de fortalecimiento por lo [employer] por semana. Agregue esto a su plan de ejercicio de intensidad  moderada. Pase menos tiempo sentado. Incluso la actividad fsica ligera puede ser beneficiosa. Controle sus niveles de colesterol y lpidos en la sangre Comience a realizarse anlisis de lpidos y Oncologist en la sangre a los 20 aos y luego reptalos cada 5 aos. Es posible que Insurance underwriter los niveles de colesterol con mayor frecuencia si: Sus niveles de lpidos y colesterol son altos. Es mayor de 40 aos. Presenta un alto riesgo de padecer enfermedades cardacas. Qu debo saber sobre las pruebas de deteccin del cncer? Muchos tipos de cncer pueden detectarse de manera temprana y, a menudo, pueden prevenirse. Segn su historia clnica y sus antecedentes familiares, es posible que deba realizarse pruebas de deteccin del cncer en diferentes edades. Esto puede incluir pruebas de deteccin de lo siguiente: Building services engineer. Cncer de prstata. Cncer de piel. Cncer de pulmn. Qu debo saber sobre la enfermedad cardaca, la diabetes y la hipertensin arterial? Presin arterial y enfermedad cardaca La hipertensin arterial causa enfermedades cardacas y lesotho el riesgo de accidente cerebrovascular. Es ms probable que esto se manifieste en las personas que tienen lecturas de presin arterial alta o tienen sobrepeso. Hable con el mdico sobre sus valores de presin arterial deseados. Hgase controlar la presin arterial: Cada 3 a 5 aos si tiene entre 18 y 92 aos. Todos los aos si es mayor de 40 aos. Si tiene entre 65 y 36 aos y es fumador o Insurance underwriter, pregntele al mdico si debe realizarse una prueba de deteccin de aneurisma artico abdominal (AAA) por nica vez. Diabetes Realcese exmenes de deteccin de la diabetes con regularidad. Este anlisis revisa el nivel de azcar en la sangre en [place]. Hgase las pruebas de deteccin: Cada tres aos despus de los 45 aos de edad si tiene un peso normal y un bajo riesgo de padecer diabetes. Con ms frecuencia y a partir de  [place] edad inferior si tiene sobrepeso o un alto riesgo  de padecer diabetes. Qu debo saber sobre la prevencin de infecciones? Hepatitis B Si tiene un riesgo ms alto de contraer hepatitis B, debe someterse a un examen de deteccin de este virus. Hable con el mdico para averiguar si tiene riesgo de contraer la infeccin por hepatitis B. Hepatitis C Se recomienda un anlisis de [place] para: Todos los que nacieron entre 1945 y [phone]. Todas las personas que tengan un riesgo de haber contrado hepatitis C. Enfermedades de transmisin sexual (ETS) Debe realizarse pruebas de deteccin de ITS todos los aos, incluidas la gonorrea y la clamidia, si: Es sexualmente activo y es menor de [address]. Es mayor de [address], y Public affairs consultant informa que corre riesgo de tener este tipo de infecciones. La actividad sexual ha cambiado desde que le hicieron la ltima prueba de deteccin y tiene un riesgo mayor de tener clamidia o Copy. Pregntele al mdico si usted tiene riesgo. Pregntele al mdico si usted tiene un alto riesgo de Primary school teacher VIH. El mdico tambin puede recomendarle un medicamento recetado para ayudar a evitar la infeccin por el VIH. Si elige tomar medicamentos para prevenir el VIH, primero debe [employer] de deteccin del VIH. Luego debe hacerse anlisis cada 3 meses mientras est tomando los medicamentos. Siga estas indicaciones en su casa: Consumo de alcohol No beba alcohol si el mdico se lo prohbe. Si bebe alcohol: Limite la cantidad que consume de 0 a 2 bebidas por da. Sepa cunta cantidad de alcohol hay en las bebidas que toma. En los [address], una medida equivale a una botella de cerveza de 12 oz (355 ml), un vaso de vino de 5 oz (148 ml) o un vaso de una bebida alcohlica de alta graduacin de 1 oz (44 ml). Estilo de vida No consuma ningn producto que contenga nicotina o tabaco. Estos productos incluyen cigarrillos, tabaco para Theatre manager y aparatos de vapeo, como los  cigarrillos electrnicos. Si necesita ayuda para dejar de consumir estos productos, consulte al mdico. No consuma drogas. No comparta agujas. Solicite ayuda a su mdico si necesita apoyo o informacin para abandonar las drogas. Indicaciones generales Realcese los estudios de rutina de [address], dentales y de Wellsite geologist. Mantngase al da con las vacunas. Infrmele a su mdico si: Se siente deprimido con frecuencia. Alguna vez ha sido vctima de maltrato o no se siente seguro en su casa. Resumen Adoptar un estilo de vida saludable y recibir atencin preventiva son importantes para promover la salud y Counsellor. Siga las instrucciones del mdico acerca de una dieta saludable, el ejercicio y la realizacin de pruebas o exmenes para Hotel manager. Siga las instrucciones del mdico con respecto al control del colesterol y la presin arterial. Esta informacin no tiene Theme park manager el consejo del mdico. Asegrese de hacerle al mdico cualquier pregunta que tenga. Document Revised: 02/15/2021 Document Reviewed: 02/15/2021 Elsevier Patient Education  2024 Elsevier Inc.     Emil Schaumann, MD [facility]

## 2024-07-30 ENCOUNTER — Other Ambulatory Visit: Payer: Self-pay | Admitting: Emergency Medicine

## 2024-07-30 DIAGNOSIS — E785 Hyperlipidemia, unspecified: Secondary | ICD-10-CM

## 2024-08-12 ENCOUNTER — Ambulatory Visit: Admitting: Physician Assistant

## 2025-06-08 ENCOUNTER — Encounter: Admitting: Emergency Medicine
